# Patient Record
Sex: Male | Born: 1945 | Race: Black or African American | Hispanic: No | Marital: Married | Smoking: Never smoker
Health system: Southern US, Community
[De-identification: ages and names within clinical notes are randomized; demographics above are authoritative.]

## PROBLEM LIST (undated history)

## (undated) DIAGNOSIS — R0602 Shortness of breath: Secondary | ICD-10-CM

## (undated) DIAGNOSIS — K4090 Unilateral inguinal hernia, without obstruction or gangrene, not specified as recurrent: Secondary | ICD-10-CM

## (undated) DIAGNOSIS — E7800 Pure hypercholesterolemia, unspecified: Secondary | ICD-10-CM

## (undated) DIAGNOSIS — Z889 Allergy status to unspecified drugs, medicaments and biological substances status: Secondary | ICD-10-CM

## (undated) DIAGNOSIS — J42 Unspecified chronic bronchitis: Secondary | ICD-10-CM

## (undated) DIAGNOSIS — L03213 Periorbital cellulitis: Secondary | ICD-10-CM

## (undated) DIAGNOSIS — I10 Essential (primary) hypertension: Secondary | ICD-10-CM

## (undated) DIAGNOSIS — F4310 Post-traumatic stress disorder, unspecified: Secondary | ICD-10-CM

## (undated) DIAGNOSIS — J329 Chronic sinusitis, unspecified: Secondary | ICD-10-CM

## (undated) DIAGNOSIS — E119 Type 2 diabetes mellitus without complications: Secondary | ICD-10-CM

## (undated) DIAGNOSIS — J449 Chronic obstructive pulmonary disease, unspecified: Secondary | ICD-10-CM

## (undated) HISTORY — PX: SHOULDER ARTHROSCOPY W/ ROTATOR CUFF REPAIR: SHX2400

---

## 2011-09-07 ENCOUNTER — Emergency Department (INDEPENDENT_AMBULATORY_CARE_PROVIDER_SITE_OTHER)
Admission: EM | Admit: 2011-09-07 | Discharge: 2011-09-07 | Disposition: A | Discharge status: Home / Self Care | Payer: Medicare Other | Source: Home / Self Care

## 2011-09-07 ENCOUNTER — Encounter (HOSPITAL_COMMUNITY): Payer: Self-pay

## 2011-09-07 DIAGNOSIS — J309 Allergic rhinitis, unspecified: Secondary | ICD-10-CM

## 2011-09-07 DIAGNOSIS — J45901 Unspecified asthma with (acute) exacerbation: Secondary | ICD-10-CM

## 2011-09-07 HISTORY — DX: Allergy status to unspecified drugs, medicaments and biological substances: Z88.9

## 2011-09-07 HISTORY — DX: Essential (primary) hypertension: I10

## 2011-09-07 MED ORDER — PREDNISONE 20 MG PO TABS: 40.0000 mg | ORAL_TABLET | Freq: Every day | ORAL | Status: AC

## 2011-09-07 NOTE — Discharge Instructions (Signed)
Thank you for coming in today. Take the prednisone 2 tablets once a day for 5 days. Continue your albuterol inhaler as needed. Call or go to the emergency room if you get worse, have trouble breathing, have chest pains, or palpitations.   Allergic Rhinitis Allergic rhinitis is when the mucous membranes in the nose respond to allergens. Allergens are particles in the air that cause your body to have an allergic reaction. This causes you to release allergic antibodies. Through a chain of events, these eventually cause you to release histamine into the blood stream (hence the use of antihistamines). Although meant to be protective to the body, it is this release that causes your discomfort, such as frequent sneezing, congestion and an itchy runny nose.  CAUSES  The pollen allergens may come from grasses, trees, and weeds. This is seasonal allergic rhinitis, or "hay fever." Other allergens cause year-round allergic rhinitis (perennial allergic rhinitis) such as house dust mite allergen, pet dander and mold spores.  SYMPTOMS   Nasal stuffiness (congestion).   Runny, itchy nose with sneezing and tearing of the eyes.   There is often an itching of the mouth, eyes and ears.  It cannot be cured, but it can be controlled with medications. DIAGNOSIS  If you are unable to determine the offending allergen, skin or blood testing may find it. TREATMENT   Avoid the allergen.   Medications and allergy shots (immunotherapy) can help.   Hay fever may often be treated with antihistamines in pill or nasal spray forms. Antihistamines block the effects of histamine. There are over-the-counter medicines that may help with nasal congestion and swelling around the eyes. Check with your caregiver before taking or giving this medicine.  If the treatment above does not work, there are many new medications your caregiver can prescribe. Stronger medications may be used if initial measures are ineffective. Desensitizing  injections can be used if medications and avoidance fails. Desensitization is when a patient is given ongoing shots until the body becomes less sensitive to the allergen. Make sure you follow up with your caregiver if problems continue. SEEK MEDICAL CARE IF:   You develop fever (more than 100.5 F (38.1 C).   You develop a cough that does not stop easily (persistent).   You have shortness of breath.   You start wheezing.   Symptoms interfere with normal daily activities.  Document Released: 02/15/2001 Document Revised: 05/12/2011 Document Reviewed: 08/27/2008 [facility] Patient Information 2012 [place], [place].

## 2011-09-07 NOTE — ED Provider Notes (Signed)
Travis Dalton is a 66 y.o. male who presents to Urgent Care today for sinus congestion and wheezing for 2 weeks. Associated with clear nasal discharge. Travis Dalton is a patient of the [facility] and treated regularly for asthma exacerbations and sinus congestion. He typically is able to be treated with prednisone 40 mg daily for 5 days.  He denies any fevers or chills does note some wheezing.  He denies any significant dyspnea on exertion.  He has tried multiple products in the past and currently is taking cetirizine and montelukast as well as albuterol.  [facility].   PMH reviewed. Significant for asthma. 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  no chest pains, palpitations, fevers, chills, abdominal pain nausea or vomiting. Medications reviewed. No current facility-administered medications for this encounter.   Current Outpatient Prescriptions  Medication Sig Dispense Refill  . cetirizine (ZYRTEC) 10 MG tablet Take 10 mg by mouth daily.      . montelukast (SINGULAIR) 10 MG tablet Take 10 mg by mouth at bedtime.      . predniSONE (DELTASONE) 20 MG tablet Take 2 tablets (40 mg total) by mouth daily.  10 tablet  0    Exam:  BP 172/93  Pulse 88  Temp(Src) 98.2 F (36.8 C) (Oral)  Resp 20  SpO2 99% Gen: Well NAD HEENT: EOMI,  MMM, tympanic membranes are normal bilaterally. Bilateral nasal turbinate boggy and red. Posterior pharynx is normal appearing Lungs: Expiratory wheezes noted bilaterally however work of breathing is normal. Heart: RRR no MRG Abd: NABS, NT, ND Exts: Non edematous BL  LE, warm and well perfused.   No results found for this or any previous visit (from the past 24 hour(s)). No results found.  Assessment and Plan: 66 y.o. male with nasal congestion and asthma.  This is a routine exacerbation for this patient and he gets good results with  treatment of prednisone. He does not appear to be acutely ill currently, and he has good followup with his regular doctor in one week.  Feel that a trial of his usual prednisone is reasonable. Plan to continue montelukast and Zyrtec and albuterol. Discussed warning signs or symptoms that would promote return to health care. Patient expresses understanding. Please see discharge instructions.     Travis Bong, MD 09/07/11 [phone]

## 2011-09-07 NOTE — ED Notes (Signed)
I get this problem every few months; going to see my MD at the [place] in a few days

## 2011-09-07 NOTE — ED Provider Notes (Signed)
Medical screening examination/treatment/procedure(s) were performed by resident physician or non-physician practitioner and as supervising physician I was immediately available for consultation/collaboration.   Barkley Bruns MD.    Linna Hoff, MD 09/07/11 [phone]

## 2011-09-15 ENCOUNTER — Emergency Department (INDEPENDENT_AMBULATORY_CARE_PROVIDER_SITE_OTHER)
Admission: EM | Admit: 2011-09-15 | Discharge: 2011-09-15 | Disposition: A | Discharge status: Home Health | Payer: Medicare Other | Source: Home / Self Care | Attending: Family Medicine | Admitting: Family Medicine

## 2011-09-15 ENCOUNTER — Emergency Department (INDEPENDENT_AMBULATORY_CARE_PROVIDER_SITE_OTHER): Payer: Medicare Other

## 2011-09-15 ENCOUNTER — Encounter (HOSPITAL_COMMUNITY): Payer: Self-pay

## 2011-09-15 DIAGNOSIS — J45909 Unspecified asthma, uncomplicated: Secondary | ICD-10-CM

## 2011-09-15 MED ORDER — IPRATROPIUM BROMIDE 0.02 % IN SOLN
0.5000 mg | Freq: Once | RESPIRATORY_TRACT | Status: AC
  Administered 2011-09-15: 0.5 mg via RESPIRATORY_TRACT

## 2011-09-15 MED ORDER — METHYLPREDNISOLONE SODIUM SUCC 125 MG IJ SOLR
125.0000 mg | Freq: Once | INTRAMUSCULAR | Status: AC
  Administered 2011-09-15: 125 mg via INTRAMUSCULAR

## 2011-09-15 MED ORDER — ALBUTEROL SULFATE (5 MG/ML) 0.5% IN NEBU
5.0000 mg | INHALATION_SOLUTION | Freq: Once | RESPIRATORY_TRACT | Status: AC
  Administered 2011-09-15: 5 mg via RESPIRATORY_TRACT

## 2011-09-15 MED ORDER — METHYLPREDNISOLONE SODIUM SUCC 125 MG IJ SOLR
INTRAMUSCULAR | Status: AC
  Filled 2011-09-15: qty 2

## 2011-09-15 MED ORDER — PREDNISONE (PAK) 10 MG PO TABS: ORAL_TABLET | ORAL | Status: DC

## 2011-09-15 MED ORDER — ALBUTEROL SULFATE (5 MG/ML) 0.5% IN NEBU
INHALATION_SOLUTION | RESPIRATORY_TRACT | Status: AC
  Filled 2011-09-15: qty 1

## 2011-09-15 NOTE — ED Provider Notes (Signed)
History     CSN: [id number]  Arrival date & time 09/15/11  1243   First MD Initiated Contact with Patient 09/15/11 1415      Chief Complaint  Patient presents with  . Shortness of Breath    (Consider location/radiation/quality/duration/timing/severity/associated sxs/prior treatment) HPI Comments: THE PATIENT REPORTS HE WAS SEEN HERE OVER A WK AGO FOR WHEEZING AND SHORTNESS OF BREATH. TREATED WITH ORAL PREDNISONE. MINIMAL RELIEF. [facility] STILL NO [facility]PROVEMENT. USING HIS INHALER. NO FEVER . HE REPORTS HE SEEMS TO NEED TREATMENT EVERY 60 DAYS FOR THE EXACT SAME SYMPTOMS.   The history is provided by the patient.    Past Medical History  Diagnosis Date  . Hypertension   . Multiple allergies   . Asthma     History reviewed. No pertinent past surgical history.  History reviewed. No pertinent family history.  History  Substance Use Topics  . Smoking status: Never Smoker   . Smokeless tobacco: Not on file  . Alcohol Use: Yes      Review of Systems  Constitutional: Negative.   HENT: Negative.   Respiratory: Positive for cough, shortness of breath and wheezing.   Cardiovascular: Negative.   Gastrointestinal: Negative.   Genitourinary: Negative.   Musculoskeletal: Negative.   Skin: Negative.     Allergies  Review of patient's allergies indicates no known allergies.  Home Medications   Current Outpatient Rx  Name Route Sig Dispense Refill  . CETIRIZINE HCL 10 MG PO TABS Oral Take 10 mg by mouth daily.    Marland Kitchen MONTELUKAST SODIUM 10 MG PO TABS Oral Take 10 mg by mouth at bedtime.    Marland Kitchen PREDNISONE 20 MG PO TABS Oral Take 2 tablets (40 mg total) by mouth daily. 10 tablet 0  . PREDNISONE (PAK) 10 MG PO TABS  DISP 12 DAY TAPER TAKE AS DIRECTED WITH FOOD 42 tablet 0    BP 146/89  Pulse 100  Temp(Src) 98.5 F (36.9 C) (Oral)  Resp 20  SpO2 100%  Physical Exam  Nursing note and vitals reviewed. Constitutional: He appears well-developed and  well-nourished. No distress.       SPEAKING IN FULL SENTENCES.   HENT:  Head: Normocephalic and atraumatic.  Mouth/Throat: Oropharynx is clear and moist.  Neck: Normal range of motion. Neck supple.  Cardiovascular: Normal rate and regular rhythm.   Pulmonary/Chest: Effort normal. He has wheezes.  Musculoskeletal: He exhibits no edema.  Neurological: He is alert.  Skin: Skin is warm and dry. No rash noted. No pallor.    ED Course  Procedures (including critical care time) MUCH IMPROVED POST NEB TX. CHEST XRAY NEG  Labs Reviewed - No data to display Dg Chest 2 View  09/15/2011  *RADIOLOGY REPORT*  Clinical Data: Cough, congestion, shortness of breath  CHEST - 2 VIEW  Comparison: None.  Findings: The lungs are clear and slightly hyperaerated.  Mild peribronchial thickening is noted which may indicate bronchitis. Mediastinal contours appear normal.  The heart is within normal limits in size.  No bony abnormality is seen.  IMPRESSION: No pneumonia.  Peribronchial thickening may indicate bronchitis. Slight hyperaeration.  Original Report Authenticated By: Juline Patch, M.D.     1. Asthmatic bronchitis       MDM          Randa Spike, MD 09/15/11 [phone]

## 2011-09-15 NOTE — ED Notes (Signed)
Pt c/o SOB for past week.  Pt states he was seen here Tuesday, given Rx for prednisone but not effective.  Pt did inhaler and nebs at home with no relief.  Pt has inspiratory and expiratory wheezing bilaterally.

## 2011-09-15 NOTE — Discharge Instructions (Signed)
USE YOUR INHALER AS DIRECTED. USE THE NASAL SPRAY TWICE DAILY FOR NO MORE THAN 4 DAYS THEN CUT TO ONCE DAILY. AVOID MILK PRODUCTS. CONTINUE YOUR SINGULAR AND ZYRTEC. REPORT TO THE LOCAL ER IF SYMPTOMS PERSIST OR WORSEN.

## 2011-11-18 ENCOUNTER — Emergency Department (INDEPENDENT_AMBULATORY_CARE_PROVIDER_SITE_OTHER)
Admission: EM | Admit: 2011-11-18 | Discharge: 2011-11-18 | Disposition: A | Discharge status: Home / Self Care | Payer: Medicare Other | Source: Home / Self Care | Attending: Family Medicine | Admitting: Family Medicine

## 2011-11-18 ENCOUNTER — Encounter (HOSPITAL_COMMUNITY): Payer: Self-pay | Admitting: *Deleted

## 2011-11-18 DIAGNOSIS — J329 Chronic sinusitis, unspecified: Secondary | ICD-10-CM

## 2011-11-18 MED ORDER — AMOXICILLIN-POT CLAVULANATE 875-125 MG PO TABS: 1.0000 | ORAL_TABLET | Freq: Two times a day (BID) | ORAL | Status: AC

## 2011-11-18 NOTE — ED Provider Notes (Signed)
History     CSN: [id number]  Arrival date & time 11/18/11  1333   First MD Initiated Contact with Patient 11/18/11 1504      Chief Complaint  Patient presents with  . URI    (Consider location/radiation/quality/duration/timing/severity/associated sxs/prior treatment) HPI Comments: Pt reports having sx of "a regular cold" several weeks ago that "won't go away".  Thick yellow d/c from nose when can get d/c from nose.    Patient is a 66 y.o. male presenting with URI. The history is provided by the patient.  URI Primary symptoms do not include fever, headaches, ear pain, sore throat or cough. Episode onset: several weeks ago. This is a new problem. The problem has not changed since onset. Symptoms associated with the illness include sinus pressure and congestion. The illness is not associated with chills, facial pain or rhinorrhea.    Past Medical History  Diagnosis Date  . Hypertension   . Multiple allergies   . Asthma   . Diabetes mellitus     History reviewed. No pertinent past surgical history.  History reviewed. No pertinent family history.  History  Substance Use Topics  . Smoking status: Never Smoker   . Smokeless tobacco: Not on file  . Alcohol Use: No      Review of Systems  Constitutional: Negative for fever and chills.  HENT: Positive for congestion and sinus pressure. Negative for ear pain, sore throat, rhinorrhea and postnasal drip.   Respiratory: Negative for cough.   Neurological: Negative for headaches.    Allergies  Review of patient's allergies indicates no known allergies.  Home Medications   Current Outpatient Rx  Name Route Sig Dispense Refill  . HYDROCHLOROTHIAZIDE PO Oral Take by mouth.    . METFORMIN HCL PO Oral Take by mouth 2 (two) times daily.    . AMOXICILLIN-POT CLAVULANATE 875-125 MG PO TABS Oral Take 1 tablet by mouth 2 (two) times daily. 20 tablet 0  . CETIRIZINE HCL 10 MG PO TABS Oral Take 10 mg by mouth daily.    Marland Kitchen  MONTELUKAST SODIUM 10 MG PO TABS Oral Take 10 mg by mouth at bedtime.    Marland Kitchen PREDNISONE (PAK) 10 MG PO TABS  DISP 12 DAY TAPER TAKE AS DIRECTED WITH FOOD 42 tablet 0    BP 125/91  Pulse 76  Temp 98.3 F (36.8 C) (Oral)  Resp 18  SpO2 99%  Physical Exam  Constitutional: He appears well-developed and well-nourished. No distress.  HENT:  Right Ear: External ear and ear canal normal. Tympanic membrane is retracted.  Left Ear: External ear and ear canal normal. Tympanic membrane is retracted.  Nose: Mucosal edema present. Right sinus exhibits no maxillary sinus tenderness and no frontal sinus tenderness. Left sinus exhibits no maxillary sinus tenderness and no frontal sinus tenderness.  Mouth/Throat: Oropharynx is clear and moist.       Thick, purulent drainage in B nares.  Congestion so severe pt cannot breathe through nose.   Cardiovascular: Normal rate and regular rhythm.   Pulmonary/Chest: Effort normal and breath sounds normal.  Lymphadenopathy:       Head (right side): No submental, no submandibular, no tonsillar and no preauricular adenopathy present.       Head (left side): No submental, no submandibular, no tonsillar and no preauricular adenopathy present.    He has no cervical adenopathy.    ED Course  Procedures (including critical care time)  Labs Reviewed - No data to display No results found.  1. Sinusitis       MDM          Cathlyn Parsons, NP 11/18/11 1515

## 2011-11-18 NOTE — Discharge Instructions (Signed)
Use saline spray and/or a neti pot with saline in your nose to help thin out the drainage and move it out.  Drink LOTS of liquids. Finish ALL of the antibiotics, even if you are feeling better.   Sinusitis Sinusitis an infection of the air pockets (sinuses) in your face. This can cause puffiness (swelling). It can also cause drainage from your sinuses.  HOME CARE   Only take medicine as told by your doctor.   Drink enough fluids to keep your pee (urine) clear or pale yellow.   Apply moist heat or ice packs for pain relief.   Use salt (saline) nose sprays. The spray will wet the thick fluid in the nose. This can help the sinuses drain.  GET HELP RIGHT AWAY IF:   You have a fever.   Your baby is older than 3 months with a rectal temperature of 102 F (38.9 C) or higher.   Your baby is 47 months old or younger with a rectal temperature of 100.4 F (38 C) or higher.   The pain gets worse.   You get a very bad headache.   You keep throwing up (vomiting).   Your face gets puffy.  MAKE SURE YOU:   Understand these instructions.   Will watch your condition.   Will get help right away if you are not doing well or get worse.  Document Released: 11/09/2007 Document Revised: 05/12/2011 Document Reviewed: 11/09/2007 [facility] Patient Information 2012 [place], [place].

## 2011-11-18 NOTE — ED Notes (Signed)
Pt  Has  Seasonal  allergys     And  Sinus problems  - he  Reports   sev weeks  Of  Sinus  Congested  wih  Stuffy  Nose  -  No  Real  Cough  He  States -  He  Appears  In no  Distress

## 2011-11-19 NOTE — ED Provider Notes (Signed)
Medical screening examination/treatment/procedure(s) were performed by resident physician or non-physician practitioner and as supervising physician I was immediately available for consultation/collaboration.   Saige Busby DOUGLAS MD.    Raelle Chambers D Jaziel Bennett, MD 11/19/11 1111 

## 2011-11-29 ENCOUNTER — Emergency Department (INDEPENDENT_AMBULATORY_CARE_PROVIDER_SITE_OTHER)
Admission: EM | Admit: 2011-11-29 | Discharge: 2011-11-29 | Disposition: A | Discharge status: Home / Self Care | Payer: Medicare Other | Source: Home / Self Care | Attending: Emergency Medicine | Admitting: Emergency Medicine

## 2011-11-29 ENCOUNTER — Encounter (HOSPITAL_COMMUNITY): Payer: Self-pay | Admitting: *Deleted

## 2011-11-29 DIAGNOSIS — J310 Chronic rhinitis: Secondary | ICD-10-CM

## 2011-11-29 DIAGNOSIS — J329 Chronic sinusitis, unspecified: Secondary | ICD-10-CM

## 2011-11-29 MED ORDER — AMOXICILLIN-POT CLAVULANATE 500-125 MG PO TABS: 1.0000 | ORAL_TABLET | Freq: Three times a day (TID) | ORAL | Status: AC

## 2011-11-29 MED ORDER — PREDNISONE 20 MG PO TABS: 40.0000 mg | ORAL_TABLET | Freq: Every day | ORAL | Status: AC

## 2011-11-29 NOTE — Discharge Instructions (Signed)

## 2011-11-29 NOTE — ED Provider Notes (Signed)
History     CSN: [id number]  Arrival date & time 11/29/11  1241   First MD Initiated Contact with Patient 11/29/11 1325      Chief Complaint  Patient presents with  . Facial Pain    (Consider location/radiation/quality/duration/timing/severity/associated sxs/prior treatment) HPI Comments: Patient returns today after having been seen by his doctor at the [facility] was prescribed azithromycin and prednisone for an ongoing sinus infection. Patient admitted that he discontinue the previously prescribed Augmentin that was prescribed and days before on his previous visit to urgent care. Patient continues expresses sinus pressure (points to both maxillary and frontal sinuses and an ongoing rhinorrhea). Patient denies any fevers, some discomfort in his throat secondary to postnasal dripping as he describes. Patient takes Zyrtec and Singulair chronically for allergies. Patient is awaiting the ear from his Dr. as they told him over the phone that they would not prescribe something but he is unaware what it is an MI take 5-6 days and he's feels as sinus congestion his not getting any better.  The history is provided by the patient.    Past Medical History  Diagnosis Date  . Hypertension   . Multiple allergies   . Asthma   . Diabetes mellitus     History reviewed. No pertinent past surgical history.  No family history on file.  History  Substance Use Topics  . Smoking status: Never Smoker   . Smokeless tobacco: Not on file  . Alcohol Use: No      Review of Systems  Constitutional: Negative for fever, chills, diaphoresis, activity change and fatigue.  HENT: Positive for congestion, rhinorrhea, postnasal drip and sinus pressure. Negative for sore throat, facial swelling, drooling, mouth sores, trouble swallowing, neck pain, neck stiffness, dental problem and voice change.   Eyes: Negative for redness and itching.  Skin: Negative for rash.  Neurological: Negative for dizziness,  weakness, light-headedness and headaches.    Allergies  Review of patient's allergies indicates no known allergies.  Home Medications   Current Outpatient Rx  Name Route Sig Dispense Refill  . AMOXICILLIN-POT CLAVULANATE 500-125 MG PO TABS Oral Take 1 tablet (500 mg total) by mouth every 8 (eight) hours. 20 tablet 0  . AMOXICILLIN-POT CLAVULANATE 875-125 MG PO TABS Oral Take 1 tablet by mouth 2 (two) times daily. 20 tablet 0  . CETIRIZINE HCL 10 MG PO TABS Oral Take 10 mg by mouth daily.    Marland Kitchen HYDROCHLOROTHIAZIDE PO Oral Take by mouth.    . METFORMIN HCL PO Oral Take by mouth 2 (two) times daily.    Marland Kitchen MONTELUKAST SODIUM 10 MG PO TABS Oral Take 10 mg by mouth at bedtime.    Marland Kitchen PREDNISONE 20 MG PO TABS Oral Take 2 tablets (40 mg total) by mouth daily. 2 tablets daily for 5 days 10 tablet 0  . PREDNISONE (PAK) 10 MG PO TABS  DISP 12 DAY TAPER TAKE AS DIRECTED WITH FOOD 42 tablet 0    BP 160/82  Pulse 96  Temp 97.9 F (36.6 C) (Oral)  Resp 20  SpO2 98%  Physical Exam  Nursing note and vitals reviewed. Constitutional: He appears well-developed and well-nourished.  Non-toxic appearance. He does not have a sickly appearance. He does not appear ill. No distress.  HENT:  Head: Normocephalic.  Mouth/Throat: Uvula is midline and mucous membranes are normal. Posterior oropharyngeal erythema present. No oropharyngeal exudate, posterior oropharyngeal edema or tonsillar abscesses.  Eyes: Conjunctivae are normal. Right eye exhibits no discharge. Left eye  exhibits no discharge.  Neck: Neck supple. No JVD present.  Pulmonary/Chest: Effort normal and breath sounds normal. He has no decreased breath sounds.  Skin: No rash noted.    ED Course  Procedures (including critical care time)  Labs Reviewed - No data to display No results found.   1. Sinusitis   2. Rhinitis       MDM  Recurrent sinusitis. With incomplete antibiotic cycle. Patient has been encouraged to complete Augmentin and a  five-day treatment course of prednisone and followup with ENT if no improvement is noted. Discuss what symptoms will warrant further evaluation members apartment. Patient agree with treatment plan and followup care as necessary        Jimmie Molly, MD 11/29/11 [phone]

## 2011-11-29 NOTE — ED Notes (Signed)
Pt  Was  Seen  11  Days ago  For  A  Sinus  Infection  He  Was  rx     augmentin   He  Reports       [facility]  [facility]   [facility]      [facility]    And  Was  rx  z  Max        -  Therefore  He  Only   Took  1/2  Of the  augmentin   He now  Reports  Frontal  Headache      And congestion

## 2012-03-15 ENCOUNTER — Inpatient Hospital Stay (HOSPITAL_BASED_OUTPATIENT_CLINIC_OR_DEPARTMENT_OTHER)
Admission: EM | Admit: 2012-03-15 | Discharge: 2012-03-16 | DRG: 603 | Disposition: A | Discharge status: Home / Self Care | Payer: Medicare Other | Attending: Internal Medicine | Admitting: Internal Medicine

## 2012-03-15 ENCOUNTER — Encounter (HOSPITAL_BASED_OUTPATIENT_CLINIC_OR_DEPARTMENT_OTHER): Payer: Self-pay | Admitting: *Deleted

## 2012-03-15 ENCOUNTER — Emergency Department (HOSPITAL_BASED_OUTPATIENT_CLINIC_OR_DEPARTMENT_OTHER): Payer: Medicare Other

## 2012-03-15 DIAGNOSIS — J019 Acute sinusitis, unspecified: Secondary | ICD-10-CM | POA: Diagnosis present

## 2012-03-15 DIAGNOSIS — J329 Chronic sinusitis, unspecified: Secondary | ICD-10-CM

## 2012-03-15 DIAGNOSIS — L03213 Periorbital cellulitis: Secondary | ICD-10-CM | POA: Diagnosis present

## 2012-03-15 DIAGNOSIS — I10 Essential (primary) hypertension: Secondary | ICD-10-CM | POA: Diagnosis present

## 2012-03-15 DIAGNOSIS — L0201 Cutaneous abscess of face: Principal | ICD-10-CM | POA: Diagnosis present

## 2012-03-15 DIAGNOSIS — E785 Hyperlipidemia, unspecified: Secondary | ICD-10-CM | POA: Diagnosis present

## 2012-03-15 DIAGNOSIS — Z79899 Other long term (current) drug therapy: Secondary | ICD-10-CM

## 2012-03-15 DIAGNOSIS — J0191 Acute recurrent sinusitis, unspecified: Secondary | ICD-10-CM

## 2012-03-15 DIAGNOSIS — J4520 Mild intermittent asthma, uncomplicated: Secondary | ICD-10-CM | POA: Diagnosis not present

## 2012-03-15 DIAGNOSIS — J45909 Unspecified asthma, uncomplicated: Secondary | ICD-10-CM | POA: Diagnosis present

## 2012-03-15 DIAGNOSIS — E119 Type 2 diabetes mellitus without complications: Secondary | ICD-10-CM | POA: Diagnosis present

## 2012-03-15 DIAGNOSIS — L03211 Cellulitis of face: Principal | ICD-10-CM | POA: Diagnosis present

## 2012-03-15 DIAGNOSIS — H00039 Abscess of eyelid unspecified eye, unspecified eyelid: Secondary | ICD-10-CM

## 2012-03-15 HISTORY — DX: Pure hypercholesterolemia, unspecified: E78.00

## 2012-03-15 HISTORY — DX: Type 2 diabetes mellitus without complications: E11.9

## 2012-03-15 HISTORY — DX: Chronic sinusitis, unspecified: J32.9

## 2012-03-15 HISTORY — DX: Unspecified chronic bronchitis: J42

## 2012-03-15 HISTORY — DX: Chronic obstructive pulmonary disease, unspecified: J44.9

## 2012-03-15 HISTORY — DX: Unilateral inguinal hernia, without obstruction or gangrene, not specified as recurrent: K40.90

## 2012-03-15 HISTORY — DX: Periorbital cellulitis: L03.213

## 2012-03-15 HISTORY — DX: Shortness of breath: R06.02

## 2012-03-15 HISTORY — DX: Post-traumatic stress disorder, unspecified: F43.10

## 2012-03-15 LAB — CBC WITH DIFFERENTIAL/PLATELET
Basophils Absolute: 0 K/uL (ref 0.0–0.1)
Basophils Absolute: 0 10*3/uL (ref 0.0–0.1)
Basophils Relative: 0 % (ref 0–1)
Basophils Relative: 0 % (ref 0–1)
Eosinophils Absolute: 0.6 K/uL (ref 0.0–0.7)
Eosinophils Relative: 8 % — ABNORMAL HIGH (ref 0–5)
HCT: 42.6 % (ref 39.0–52.0)
Hemoglobin: 14.2 g/dL (ref 13.0–17.0)
Hemoglobin: 14.1 g/dL (ref 13.0–17.0)
Lymphocytes Relative: 41 % (ref 12–46)
Lymphocytes Relative: 9 % — ABNORMAL LOW (ref 12–46)
Lymphs Abs: 3.4 K/uL (ref 0.7–4.0)
MCH: 25.8 pg — ABNORMAL LOW (ref 26.0–34.0)
MCHC: 33.3 g/dL (ref 30.0–36.0)
MCHC: 33.5 g/dL (ref 30.0–36.0)
MCV: 77.5 fL — ABNORMAL LOW (ref 78.0–100.0)
Monocytes Absolute: 0.7 K/uL (ref 0.1–1.0)
Monocytes Relative: 9 % (ref 3–12)
Monocytes Relative: 1 % — ABNORMAL LOW (ref 3–12)
Neutro Abs: 3.6 K/uL (ref 1.7–7.7)
Neutro Abs: 8.5 10*3/uL — ABNORMAL HIGH (ref 1.7–7.7)
Neutrophils Relative %: 43 % (ref 43–77)
Neutrophils Relative %: 90 % — ABNORMAL HIGH (ref 43–77)
Platelets: 265 K/uL (ref 150–400)
RBC: 5.5 MIL/uL (ref 4.22–5.81)
RBC: 5.39 MIL/uL (ref 4.22–5.81)
RDW: 13.3 % (ref 11.5–15.5)
WBC: 8.3 K/uL (ref 4.0–10.5)
WBC: 9.5 10*3/uL (ref 4.0–10.5)

## 2012-03-15 LAB — COMPREHENSIVE METABOLIC PANEL WITH GFR
ALT: 22 U/L (ref 0–53)
AST: 18 U/L (ref 0–37)
Albumin: 3.7 g/dL (ref 3.5–5.2)
Alkaline Phosphatase: 56 U/L (ref 39–117)
BUN: 16 mg/dL (ref 6–23)
CO2: 29 meq/L (ref 19–32)
Calcium: 9.1 mg/dL (ref 8.4–10.5)
Chloride: 102 meq/L (ref 96–112)
Creatinine, Ser: 1.2 mg/dL (ref 0.50–1.35)
GFR calc Af Amer: 70 mL/min — ABNORMAL LOW
GFR calc non Af Amer: 60 mL/min — ABNORMAL LOW
Glucose, Bld: 68 mg/dL — ABNORMAL LOW (ref 70–99)
Potassium: 3.5 meq/L (ref 3.5–5.1)
Sodium: 139 meq/L (ref 135–145)
Total Bilirubin: 0.8 mg/dL (ref 0.3–1.2)
Total Protein: 7.3 g/dL (ref 6.0–8.3)

## 2012-03-15 MED ORDER — METFORMIN HCL 500 MG PO TABS
500.0000 mg | ORAL_TABLET | Freq: Two times a day (BID) | ORAL | Status: DC
  Administered 2012-03-16: 500 mg via ORAL
  Filled 2012-03-15 (×3): qty 1

## 2012-03-15 MED ORDER — ADULT MULTIVITAMIN W/MINERALS CH
1.0000 | ORAL_TABLET | Freq: Every day | ORAL | Status: DC
  Administered 2012-03-15 – 2012-03-16 (×2): 1 via ORAL
  Filled 2012-03-15 (×2): qty 1

## 2012-03-15 MED ORDER — ALBUTEROL SULFATE HFA 108 (90 BASE) MCG/ACT IN AERS
2.0000 | INHALATION_SPRAY | Freq: Four times a day (QID) | RESPIRATORY_TRACT | Status: DC | PRN
  Filled 2012-03-15: qty 6.7

## 2012-03-15 MED ORDER — OXYMETAZOLINE HCL 0.05 % NA SOLN
1.0000 | Freq: Two times a day (BID) | NASAL | Status: DC
  Administered 2012-03-15: 1 via NASAL
  Filled 2012-03-15: qty 15

## 2012-03-15 MED ORDER — SODIUM CHLORIDE 0.9 % IJ SOLN: 3.0000 mL | INTRAMUSCULAR | Status: DC | PRN

## 2012-03-15 MED ORDER — MULTI-VITAMIN/MINERALS PO TABS
1.0000 | ORAL_TABLET | Freq: Every day | ORAL | Status: DC
Start: 2012-03-15 — End: 2012-03-15

## 2012-03-15 MED ORDER — VANCOMYCIN HCL IN DEXTROSE 1-5 GM/200ML-% IV SOLN
1000.0000 mg | Freq: Once | INTRAVENOUS | Status: AC
  Administered 2012-03-15: 1000 mg via INTRAVENOUS
  Filled 2012-03-15: qty 200

## 2012-03-15 MED ORDER — MONTELUKAST SODIUM 10 MG PO TABS
10.0000 mg | ORAL_TABLET | Freq: Every day | ORAL | Status: DC
  Administered 2012-03-15: 10 mg via ORAL
  Filled 2012-03-15 (×2): qty 1

## 2012-03-15 MED ORDER — GLIPIZIDE 2.5 MG HALF TABLET
2.5000 mg | ORAL_TABLET | Freq: Two times a day (BID) | ORAL | Status: DC
  Administered 2012-03-16: 2.5 mg via ORAL
  Filled 2012-03-15 (×3): qty 1

## 2012-03-15 MED ORDER — HYDROCHLOROTHIAZIDE 25 MG PO TABS
12.5000 mg | ORAL_TABLET | Freq: Every day | ORAL | Status: DC
  Administered 2012-03-16: 12.5 mg via ORAL
  Filled 2012-03-15: qty 0.5

## 2012-03-15 MED ORDER — ATORVASTATIN CALCIUM 20 MG PO TABS
20.0000 mg | ORAL_TABLET | Freq: Every day | ORAL | Status: DC
  Administered 2012-03-15 – 2012-03-16 (×2): 20 mg via ORAL
  Filled 2012-03-15 (×2): qty 1

## 2012-03-15 MED ORDER — ALBUTEROL SULFATE (5 MG/ML) 0.5% IN NEBU: 2.5000 mg | INHALATION_SOLUTION | Freq: Four times a day (QID) | RESPIRATORY_TRACT | Status: DC | PRN

## 2012-03-15 MED ORDER — METHYLPREDNISOLONE SODIUM SUCC 40 MG IJ SOLR
40.0000 mg | Freq: Four times a day (QID) | INTRAMUSCULAR | Status: DC
  Administered 2012-03-15 – 2012-03-16 (×4): 40 mg via INTRAVENOUS
  Filled 2012-03-15 (×7): qty 1

## 2012-03-15 MED ORDER — SODIUM CHLORIDE 0.9 % IJ SOLN
3.0000 mL | Freq: Two times a day (BID) | INTRAMUSCULAR | Status: DC
  Administered 2012-03-15: 3 mL via INTRAVENOUS

## 2012-03-15 MED ORDER — INSULIN ASPART 100 UNIT/ML ~~LOC~~ SOLN: 0.0000 [IU] | Freq: Three times a day (TID) | SUBCUTANEOUS | Status: DC

## 2012-03-15 MED ORDER — SODIUM CHLORIDE 0.9 % IV BOLUS (SEPSIS)
500.0000 mL | Freq: Once | INTRAVENOUS | Status: AC
  Administered 2012-03-15: 12:00:00 via INTRAVENOUS

## 2012-03-15 MED ORDER — FLUNISOLIDE 25 MCG/ACT (0.025%) NA SOLN
2.0000 | Freq: Two times a day (BID) | NASAL | Status: DC | PRN
  Filled 2012-03-15: qty 25

## 2012-03-15 MED ORDER — INSULIN ASPART 100 UNIT/ML ~~LOC~~ SOLN
0.0000 [IU] | Freq: Every day | SUBCUTANEOUS | Status: DC
  Administered 2012-03-15: 4 [IU] via SUBCUTANEOUS

## 2012-03-15 MED ORDER — FLUTICASONE PROPIONATE 50 MCG/ACT NA SUSP
2.0000 | Freq: Every day | NASAL | Status: DC
  Administered 2012-03-15 – 2012-03-16 (×2): 2 via NASAL
  Filled 2012-03-15: qty 16

## 2012-03-15 MED ORDER — FAMOTIDINE 20 MG PO TABS
20.0000 mg | ORAL_TABLET | Freq: Two times a day (BID) | ORAL | Status: DC
  Administered 2012-03-15 – 2012-03-16 (×2): 20 mg via ORAL
  Filled 2012-03-15 (×3): qty 1

## 2012-03-15 MED ORDER — DEXTROSE 5 % IV SOLN
1.0000 g | Freq: Once | INTRAVENOUS | Status: AC
  Administered 2012-03-15: 1 g via INTRAVENOUS
  Filled 2012-03-15: qty 10

## 2012-03-15 MED ORDER — SODIUM CHLORIDE 0.9 % IV SOLN
3.0000 g | Freq: Four times a day (QID) | INTRAVENOUS | Status: DC
  Administered 2012-03-15 – 2012-03-16 (×3): 3 g via INTRAVENOUS
  Filled 2012-03-15 (×7): qty 3

## 2012-03-15 MED ORDER — PSEUDOEPHEDRINE HCL 30 MG PO TABS
30.0000 mg | ORAL_TABLET | Freq: Four times a day (QID) | ORAL | Status: DC | PRN
  Filled 2012-03-15: qty 1

## 2012-03-15 MED ORDER — PIPERACILLIN-TAZOBACTAM 3.375 G IVPB
3.3750 g | Freq: Once | INTRAVENOUS | Status: AC
  Administered 2012-03-15: 3.375 g via INTRAVENOUS
  Filled 2012-03-15: qty 50

## 2012-03-15 MED ORDER — INSULIN ASPART 100 UNIT/ML ~~LOC~~ SOLN
0.0000 [IU] | Freq: Three times a day (TID) | SUBCUTANEOUS | Status: DC
  Administered 2012-03-16: 8 [IU] via SUBCUTANEOUS
  Administered 2012-03-16: 3 [IU] via SUBCUTANEOUS

## 2012-03-15 MED ORDER — LORATADINE 10 MG PO TABS
10.0000 mg | ORAL_TABLET | Freq: Every day | ORAL | Status: DC
  Administered 2012-03-15 – 2012-03-16 (×2): 10 mg via ORAL
  Filled 2012-03-15 (×2): qty 1

## 2012-03-15 MED ORDER — METHYLPREDNISOLONE SODIUM SUCC 125 MG IJ SOLR
125.0000 mg | Freq: Once | INTRAMUSCULAR | Status: AC
  Administered 2012-03-15: 125 mg via INTRAVENOUS
  Filled 2012-03-15: qty 2

## 2012-03-15 MED ORDER — SODIUM CHLORIDE 0.9 % IV SOLN: 250.0000 mL | INTRAVENOUS | Status: DC | PRN

## 2012-03-15 MED ORDER — ENOXAPARIN SODIUM 40 MG/0.4ML ~~LOC~~ SOLN
40.0000 mg | SUBCUTANEOUS | Status: DC
  Administered 2012-03-15: 40 mg via SUBCUTANEOUS
  Filled 2012-03-15 (×2): qty 0.4

## 2012-03-15 MED ORDER — BUDESONIDE-FORMOTEROL FUMARATE 160-4.5 MCG/ACT IN AERO
1.0000 | INHALATION_SPRAY | Freq: Two times a day (BID) | RESPIRATORY_TRACT | Status: DC
  Administered 2012-03-15 – 2012-03-16 (×2): 1 via RESPIRATORY_TRACT
  Filled 2012-03-15: qty 6

## 2012-03-15 MED ORDER — GUAIFENESIN-CODEINE 100-10 MG/5ML PO SOLN: 10.0000 mL | Freq: Four times a day (QID) | ORAL | Status: DC | PRN

## 2012-03-15 NOTE — ED Notes (Signed)
Here for complaint of stuffy head and runny nose since yesterday. States he was treated for sinusitis the first of October while out of town and just finished Prednisone.

## 2012-03-15 NOTE — ED Provider Notes (Addendum)
History     CSN: [id number]  Arrival date & time 03/15/12  1137   First MD Initiated Contact with Patient 03/15/12 1147      Chief Complaint  Patient presents with  . Recurrent Sinusitis    (Consider location/radiation/quality/duration/timing/severity/associated sxs/prior treatment) HPI Patient with history diagnosed with sinusitis 10/1 and finished meds (prednisone and levaquin) Tuesday.  Became congested again last night and began feeling like nose clogged up and pressure in face today with chills.  Patient with history of asthma sttes he had cxr when diagnosed with sinusitis in [place] last week.  [facility]   Past Medical History  Diagnosis Date  . Hypertension   . Multiple allergies   . Asthma   . Diabetes mellitus   . Sinusitis     History reviewed. No pertinent past surgical history.  No family history on file.  History  Substance Use Topics  . Smoking status: Never Smoker   . Smokeless tobacco: Not on file  . Alcohol Use: No      Review of Systems  Constitutional: Positive for chills. Negative for fever.  HENT: Negative for neck pain and neck stiffness.   Eyes: Negative for visual disturbance.  Respiratory: Negative for cough and shortness of breath.   Cardiovascular: Negative for chest pain.  Gastrointestinal: Negative for vomiting, diarrhea and blood in stool.  Genitourinary: Negative for dysuria, frequency and decreased urine volume.  Musculoskeletal: Negative for myalgias and joint swelling.  Skin: Negative for rash.  Neurological: Negative for weakness.  Hematological: Negative for adenopathy.  Psychiatric/Behavioral: Negative for agitation.    Allergies  Review of patient's allergies indicates no known allergies.  Home Medications   Current Outpatient Rx  Name Route Sig Dispense Refill  . CETIRIZINE HCL 10 MG PO TABS Oral Take 10 mg by mouth daily.    Marland Kitchen HYDROCHLOROTHIAZIDE PO Oral Take by mouth.    . METFORMIN HCL PO Oral Take by mouth 2  (two) times daily.    Marland Kitchen MONTELUKAST SODIUM 10 MG PO TABS Oral Take 10 mg by mouth at bedtime.    Marland Kitchen PREDNISONE (PAK) 10 MG PO TABS  DISP 12 DAY TAPER TAKE AS DIRECTED WITH FOOD 42 tablet 0    BP 146/109  Pulse 95  Temp 98.2 F (36.8 C) (Oral)  Resp 20  SpO2 100%  Physical Exam  Nursing note and vitals reviewed. Constitutional: He is oriented to person, place, and time. He appears well-developed and well-nourished.  HENT:       Facial erythema of the left periorbital area with diffuse facial tenderness  Eyes: EOM are normal. Pupils are equal, round, and reactive to light.       Left conjuctiva injected.   Neck: Normal range of motion.  Cardiovascular: Normal rate and regular rhythm.   Pulmonary/Chest: Effort normal and breath sounds normal.  Abdominal: Soft. Bowel sounds are normal.  Neurological: He is alert and oriented to person, place, and time. He has normal reflexes.  Skin: Skin is warm and dry.  Psychiatric: He has a normal mood and affect. His behavior is normal. Judgment and thought content normal.    ED Course  Procedures (including critical care time)  Labs Reviewed - No data to display No results found.   No diagnosis found.    Patient with history of sinusitis. He was given 1 g of Rocephin. CT results obtained. Patient remains hemodynamically stable. He does have some facial swelling and preseptal cellulitis on the left side. Given her  comorbidities of diabetes, failed outpatient therapy, and facial cellulitis patient will be admitted to [facility]. I have discussed the patient's care with Dr.Abrol.    After discussion with Dr. Ricard Dillon blood cultures were ordered and Zosyn and vancomycin ordered.      Hilario Quarry, MD 03/15/12 1545  Hilario Quarry, MD 03/15/12 1610  Hilario Quarry, MD 05/02/12 [id number]

## 2012-03-15 NOTE — ED Notes (Signed)
Pt. Is in no distress and show no s/s of hypoglycemia.  Pt. Has drink at bedside.

## 2012-03-15 NOTE — ED Notes (Signed)
Pt. Had rocephin before he had blood cultures done x 2.  Dr. Rosalia Hammers aware of this.

## 2012-03-15 NOTE — H&P (Addendum)
[facility] History and Physical  Travis Dalton AVW:[id number] DOB: 1945/08/04 DOA: 03/15/2012  Referring physician: Rhina Brackett from [facility] PCP: Follows at [facility]  Chief Complaint: Recurrent sinusitis for past few weeks  HPI:  66 year old pleasant African American male with history of hypertension, diabetes mellitus on oral hypoglycemics, hyperlipidemia with symptoms of recurrent rhinosinusitis and asthma-like symptoms for past 3 years was sent from [facility] [employer] [facility] for recurrent sinusitis. Patient informs that for past 3 years he has been having at least 4 episodes of nasal congestion and discharged with sinusitis-like symptoms that last anywhere from 3-4 days 3-4 weeks associated with some facial pain and whitish to yellowish nasal discharge and headaches. He denies any fevers or chills. Along with this she has been having asthma-like symptoms with some shortness of breath and wheezing. He has been treated multiple times with steroids, and nasal decongestant and antibiotics. He recently was prescribed a course of prednisone and levofloxacin that he completed. He presented to the ED today with symptoms off nasal congestion swelling of the nose and some swelling of the right upper lip more past 2 days. His wife also mentions noticing small amount of bleeding from the nose noticed since yesterday. Patient had a maxillofacial CT done in the ED which showed pansinusitis with left radial vital cellulitis and was transferred to [facility] for admission. My evaluation patient informs of nasal congestion with myofascial pain without any fever, chills or headache. He denies any nausea, vomiting, shortness of breath, chest pain, palpitations, bowel or urinary symptoms. He usually follows at [facility] and has not seen any ENT in the past for his symptoms.  Review of Systems:  Constitutional: Denies fever, chills, diaphoresis, appetite change and fatigue.  HEENT: Severe   nasal congestion and rhinorrhea . Has occasional facial pain. Denies photophobia, eye pain, redness, hearing loss, ear pain, sore throat,sneezing, mouth sores, trouble swallowing, neck pain, neck stiffness and tinnitus.   Respiratory: Denies SOB, DOE, cough, chest tightness,  and wheezing.   Cardiovascular: Denies chest pain, palpitations and leg swelling.  Gastrointestinal: Denies nausea, vomiting, abdominal pain, diarrhea, constipation, blood in stool and abdominal distention.  Genitourinary: Denies dysuria, urgency, frequency, hematuria, flank pain and difficulty urinating.  Musculoskeletal: Denies myalgias, back pain, joint swelling, arthralgias and gait problem.  Skin: Denies pallor, rash and wound.  Neurological: Denies dizziness, seizures, syncope, weakness, light-headedness, numbness and headaches.  Hematological: Denies adenopathy. Easy bruising, personal or family bleeding history  Psychiatric/Behavioral: Denies suicidal ideation, mood changes, confusion, nervousness, sleep disturbance and agitation   Past Medical History  Diagnosis Date  . Hypertension   . Multiple allergies   . Asthma   . Diabetes mellitus   . Sinusitis    History reviewed. No pertinent past surgical history. Social History:  reports that he has never smoked. He does not have any smokeless tobacco history on file. He reports that he does not drink alcohol or use illicit drugs.  No Known Allergies  No family history on file.  Prior to Admission medications   Medication Sig Start Date End Date Taking? Authorizing Provider  atorvastatin (LIPITOR) 40 MG tablet Take 20 mg by mouth daily.    Yes Historical Provider, MD  budesonide-formoterol (SYMBICORT) 160-4.5 MCG/ACT inhaler Inhale 1 puff into the lungs 2 (two) times daily.    Yes Historical Provider, MD  cetirizine (ZYRTEC) 10 MG tablet Take 10 mg by mouth at bedtime.    Yes Historical Provider, MD  fish oil-omega-3 fatty acids 1000 MG  capsule Take 1 g by  mouth daily.   Yes Historical Provider, MD  flunisolide (NASALIDE) 0.025 % SOLN Inhale 2 sprays into the lungs 2 (two) times daily as needed. For congestion   Yes Historical Provider, MD  glipiZIDE (GLUCOTROL) 5 MG tablet Take 2.5 mg by mouth 2 (two) times daily before a meal.    Yes Historical Provider, MD  guaiFENesin-codeine 100-10 MG/5ML syrup Take 10 mLs by mouth 4 (four) times daily as needed. For cough   Yes Historical Provider, MD  hydrochlorothiazide (HYDRODIURIL) 25 MG tablet Take 12.5 mg by mouth daily.   Yes Historical Provider, MD  Ibuprofen-Diphenhydramine HCl (ADVIL PM) 200-25 MG CAPS Take 2 tablets by mouth at bedtime as needed. To help rest   Yes Historical Provider, MD  levofloxacin (LEVAQUIN) 500 MG tablet Take 500 mg by mouth daily. Finished last dose on 03-12-2012   Yes Historical Provider, MD  lisinopril (PRINIVIL,ZESTRIL) 10 MG tablet Take 5 mg by mouth daily. Patient ran out of lisinopril on 03-12-2012  Has started taking some old HCTZ  He had at home until he could see doctor   Yes Historical Provider, MD  metFORMIN (GLUCOPHAGE) 500 MG tablet Take 500 mg by mouth 2 (two) times daily with a meal.   Yes Historical Provider, MD  montelukast (SINGULAIR) 10 MG tablet Take 10 mg by mouth at bedtime.   Yes Historical Provider, MD  Multiple Vitamins-Minerals (MULTIVITAMIN WITH MINERALS) tablet Take 1 tablet by mouth daily.   Yes Historical Provider, MD  predniSONE (STERAPRED UNI-PAK) 10 MG tablet DISP 12 DAY TAPER TAKE AS DIRECTED WITH FOOD last dose taken on 03-12-2012 09/15/11  Yes Randa Spike, MD    Physical Exam:  Filed Vitals:   03/15/12 1150 03/15/12 1542 03/15/12 1730  BP: 146/109 162/94 155/85  Pulse: 95 81 95  Temp: 98.2 F (36.8 C)  97.9 F (36.6 C)  TempSrc: Oral  Oral  Resp: 20 18 18   Height:  6' (1.829 m)   Weight:  88.451 kg (195 lb) 84.1 kg (185 lb 6.5 oz)  SpO2: 100% 98% 97%    Constitutional: Vital signs reviewed.  Patient is a well-developed and  well-nourished in no acute distress and cooperative with exam. Alert and oriented x3.  HEENT: No pallor, no icterus, swelling of the nose with nasal planning voice. Has a mild swelling all the left inferior peri orbital area, no erythema or tenderness. No sinus tenderness present. Examination of the nasal cavity shows no discharge however has significant hypertrophy of the nasal  turbinates. No obvious polyps seen. There is a mild swelling on the right upper lip. Tongue and oral cavity appears normal. Posterior pharyngeal wall appears normal. No lymphadenopathy. Cardiovascular: RRR, S1 normal, S2 normal, no MRG, pulses symmetric and intact bilaterally Pulmonary/Chest: CTAB, no wheezes, rales, or rhonchi Abdominal: Soft. Non-tender, non-distended, bowel sounds are normal, no masses, organomegaly, or guarding present.  GU: no CVA tenderness Musculoskeletal: No joint deformities, erythema, or stiffness, ROM full and no nontender Ext: no edema and no cyanosis, pulses palpable bilaterally (DP and PT) Hematology: no cervical, inginal, or axillary adenopathy.  Neurological: A&O x3, Strenght is normal and symmetric bilaterally, cranial nerve II-XII are grossly intact, no focal motor deficit, sensory intact to light touch bilaterally.  Skin: Warm, dry and intact. No rash, cyanosis, or clubbing.  Psychiatric: Normal mood and affect. speech and behavior is normal. Judgment and thought content normal. Cognition and memory are normal.   Labs on Admission:  Basic Metabolic  Panel:  Lab 03/15/12 1220  NA 139  K 3.5  CL 102  CO2 29  GLUCOSE 68*  BUN 16  CREATININE 1.20  CALCIUM 9.1  MG --  PHOS --   Liver Function Tests:  Lab 03/15/12 1220  AST 18  ALT 22  ALKPHOS 56  BILITOT 0.8  PROT 7.3  ALBUMIN 3.7   No results found for this basename: LIPASE:5,AMYLASE:5 in the last 168 hours No results found for this basename: AMMONIA:5 in the last 168 hours CBC:  Lab 03/15/12 1220  WBC 8.3  NEUTROABS  3.6  HGB 14.2  HCT 42.6  MCV 77.5*  PLT 265   Cardiac Enzymes: No results found for this basename: CKTOTAL:5,CKMB:5,CKMBINDEX:5,TROPONINI:5 in the last 168 hours BNP: No components found with this basename: POCBNP:5 CBG: No results found for this basename: GLUCAP:5 in the last 168 hours  Radiological Exams on Admission: Ct Maxillofacial Wo Cm  03/15/2012  *RADIOLOGY REPORT*  Clinical Data: Sinusitis.  Congestion.  CT MAXILLOFACIAL WITHOUT CONTRAST  Technique:  Multidetector CT imaging of the maxillofacial structures was performed. Multiplanar CT image reconstructions were also generated.  Comparison: None.  Findings: Pansinusitis is present.  There is complete opacification of both frontal sinuses.  All of the ethmoid air cells are completely opacified.  Minimal aeration of the left sphenoid sinus with near complete opacification of the right sphenoid sinus.  Near- complete opacification of both maxillary sinuses.  There is infiltration of the preseptal left periorbital soft tissues, suggesting preseptal periorbital cellulitis.  Grossly, intracranial contents are within normal limits.  Intracranial atherosclerosis is present.  The patient is edentulous.  Relatively high density of the opacification of the frontal and maxillary sinuses raises the possibility of allergic fungal sinusitis.  IMPRESSION: Pansinusitis.  Left medial periorbital soft tissue swelling/infiltration compatible with preseptal periorbital cellulitis.   Original Report Authenticated By: Andreas Newport, M.D.       Assessment/Plan Principal Problem:  *Periorbital cellulitis As evident on CT of the maxillofacial area. He does have mild swelling all the left and the orbital area although not prominent. There is no skin discoloration. I. will place him on IV Unasyn for now. Patient does not have any symptoms of pain or visual disturbance. Denies any fever or chills. -Monitor for any headaches, facial pain or visual  disturbances.  Active Problems:  Acute recurrent sinusitis Patient  has symptoms of recurrent chronic rhinosinusitis. He has the symptoms almost 4 times a year for past 3 years with each episode lasting anywhere from 3-4 days to 3-4 weeks. He was recently treated with a steroid taper and levofloxacin. I do not see any obvious polyp on exam. There is no maxillary tenderness on exam except for her enlarged turbinates and mild nasal smiling to swelling. I will restart him on IV Solu Medrol 40 every 8 hours. Added pseudoephidrine.add fluticasone nasal spray-Will get a CBC with differential, ESR, ANCA and serum ACE level. -Patient will need an ENT evaluation that can be done as outpatient.   Hypertension Stable continue with HCTZ. Patient was on lisinopril started past 2 months back and stopped about a week ago. He has has a right upper lip swelling that he noticed today. He doesn't have any tongue swelling or any difficulty. I am not sure if this is related to lisinopril allergy as he has stopped taking it for one week and his symptoms started developing only since today . I will however continue to hold lisinopril and would not restart it. He is already  on steroid and Benadryl. I will provide some Pepcid.   Asthma He informs having asthma symptoms associated with rhinosinusitis for past 3 years off and on. We'll continue him on albuterol nebs when necessary and albuterol inhalers. Continue with home does of Symbicort.   Diabetes mellitus Check A1c in the morning. He was noted to have a low blood glucose in the ED of 66 but a symptomatic. Will continue him on his home medications. Monitor FSG and sliding scale coverage.  DVT prophylaxis: Subcutaneous Lovenox  Diet: Diabetic  Code Status: Full code Family Communication: Discussed with patient and his wife at bedside Disposition Plan: Home once stable  Eddie North [facility] Pager [phone]  If 7PM-7AM, please contact  night-coverage [email or web address] Password [email or web address] 03/15/2012, 6:13 PM

## 2012-03-15 NOTE — Progress Notes (Signed)
1800 Patient [facility] [place]. Nontelemetry. MD aware for admission

## 2012-03-15 NOTE — ED Notes (Signed)
Pt. Reports both nares are clogged and he feels pressure on both sides of his nose.

## 2012-03-15 NOTE — ED Notes (Signed)
Family at bedside. 

## 2012-03-15 NOTE — ED Notes (Signed)
[facility] HAS BEEN CALLED FOR TRANSPORT

## 2012-03-15 NOTE — ED Notes (Signed)
Vital signs stable. 

## 2012-03-16 DIAGNOSIS — J329 Chronic sinusitis, unspecified: Secondary | ICD-10-CM

## 2012-03-16 DIAGNOSIS — L03211 Cellulitis of face: Principal | ICD-10-CM

## 2012-03-16 LAB — HEMOGLOBIN A1C
Hgb A1c MFr Bld: 7 % — ABNORMAL HIGH (ref <5.7)
Mean Plasma Glucose: 154 mg/dL — ABNORMAL HIGH (ref <117)

## 2012-03-16 LAB — BASIC METABOLIC PANEL
BUN: 16 mg/dL (ref 6–23)
Chloride: 99 mEq/L (ref 96–112)
Creatinine, Ser: 1.04 mg/dL (ref 0.50–1.35)
GFR calc Af Amer: 83 mL/min — ABNORMAL LOW (ref >90)

## 2012-03-16 LAB — GLUCOSE, CAPILLARY: Glucose-Capillary: 176 mg/dL — ABNORMAL HIGH (ref 70–99)

## 2012-03-16 LAB — ANCA SCREEN W REFLEX TITER
c-ANCA Screen: NEGATIVE
p-ANCA Screen: NEGATIVE

## 2012-03-16 LAB — MPO/PR-3 (ANCA) ANTIBODIES: Serine Protease 3: 4 AU/mL (ref <20)

## 2012-03-16 MED ORDER — AMOXICILLIN-POT CLAVULANATE 875-125 MG PO TABS: 1.0000 | ORAL_TABLET | Freq: Two times a day (BID) | ORAL | Status: DC

## 2012-03-16 MED ORDER — FLUTICASONE PROPIONATE 50 MCG/ACT NA SUSP: 2.0000 | Freq: Every day | NASAL | Status: DC

## 2012-03-16 NOTE — Progress Notes (Signed)
Travis Dalton to be D/C'd Home per MD order.  Discussed with the patient education handouts on sinusitis and orbital cellulitis and all questions fully answered.   [name], [name]  Home Medication Instructions [id number]   Printed on:03/16/12 1345  Medication Information                    montelukast (SINGULAIR) 10 MG tablet Take 10 mg by mouth at bedtime.           cetirizine (ZYRTEC) 10 MG tablet Take 10 mg by mouth at bedtime.            atorvastatin (LIPITOR) 40 MG tablet Take 20 mg by mouth daily.            budesonide-formoterol (SYMBICORT) 160-4.5 MCG/ACT inhaler Inhale 1 puff into the lungs 2 (two) times daily.            flunisolide (NASALIDE) 0.025 % SOLN Inhale 2 sprays into the lungs 2 (two) times daily as needed. For congestion           glipiZIDE (GLUCOTROL) 5 MG tablet Take 2.5 mg by mouth 2 (two) times daily before a meal.            metFORMIN (GLUCOPHAGE) 500 MG tablet Take 500 mg by mouth 2 (two) times daily with a meal.           lisinopril (PRINIVIL,ZESTRIL) 10 MG tablet Take 5 mg by mouth daily. Patient ran out of lisinopril on 03-12-2012  Has started taking some old HCTZ  He had at home until he could see doctor           hydrochlorothiazide (HYDRODIURIL) 25 MG tablet Take 12.5 mg by mouth daily.           guaiFENesin-codeine 100-10 MG/5ML syrup Take 10 mLs by mouth 4 (four) times daily as needed. For cough           Ibuprofen-Diphenhydramine HCl (ADVIL PM) 200-25 MG CAPS Take 2 tablets by mouth at bedtime as needed. To help rest           fish oil-omega-3 fatty acids 1000 MG capsule Take 1 g by mouth daily.           Multiple Vitamins-Minerals (MULTIVITAMIN WITH MINERALS) tablet Take 1 tablet by mouth daily.           predniSONE (STERAPRED UNI-PAK) 10 MG tablet DISP 12 DAY TAPER TAKE AS DIRECTED WITH FOOD last dose taken on 03-12-2012           amoxicillin-clavulanate (AUGMENTIN) 875-125 MG per tablet Take 1 tablet by mouth 2 (two)  times daily.           fluticasone (FLONASE) 50 MCG/ACT nasal spray Place 2 sprays into the nose daily.             VVS, Skin clean, dry and intact without evidence of skin break down, no evidence of skin tears noted. IV catheter discontinued intact. Site without signs and symptoms of complications. Dressing and pressure applied.  An After Visit Summary was printed and given to the patient.  New prescriptions and medication administration times given. Patient ambulated with family member, and D/C home via private auto.  Cindra Eves, RN 03/16/2012 1:45 PM

## 2012-03-16 NOTE — Discharge Summary (Signed)
Physician Discharge Summary  Azion Centrella IEP:[id number] DOB: [birth year] DOA: 03/15/2012  PCP: No primary provider on file.  Admit date: 03/15/2012 Discharge date: 03/16/2012  Recommendations for Outpatient Follow-up:  1. Follow up with his primary care doctor in 2 weeks.  Discharge Diagnoses:  Principal Problem:  *Periorbital cellulitis Active Problems:  Acute recurrent sinusitis  Hypertension  Asthma  Diabetes mellitus   Discharge Condition: Stable  Diet recommendation: Regular diet  Filed Weights   03/15/12 1542 03/15/12 1730  Weight: 88.451 kg (195 lb) 84.1 kg (185 lb 6.5 oz)    History of present illness:  66 year old pleasant African American male with history of hypertension, diabetes mellitus on oral hypoglycemics, hyperlipidemia with symptoms of recurrent rhinosinusitis and asthma-like symptoms for past 3 years was sent from [facility] [employer] [facility] for recurrent sinusitis. Patient informs that for past 3 years he has been having at least 4 episodes of nasal congestion and discharged with sinusitis-like symptoms that last anywhere from 3-4 days 3-4 weeks associated with some facial pain and whitish to yellowish nasal discharge and headaches. He denies any fevers or chills. Along with this she has been having asthma-like symptoms with some shortness of breath and wheezing. He has been treated multiple times with steroids, and nasal decongestant and antibiotics. He recently was prescribed a course of prednisone and levofloxacin that he completed. He presented to the ED today with symptoms off nasal congestion swelling of the nose and some swelling of the right upper lip more past 2 days. His wife also mentions noticing small amount of bleeding from the nose noticed since yesterday. Patient had a maxillofacial CT done in the ED which showed pansinusitis with left radial vital cellulitis and was transferred to [facility] for admission. My evaluation patient informs of nasal  congestion with myofascial pain without any fever, chills or headache. He denies any nausea, vomiting, shortness of breath, chest pain, palpitations, bowel or urinary symptoms. He usually follows at [facility] and has not seen any ENT in the past for his symptoms   Hospital Course:  Principal Problem: Periorbital cellulitis: -He was initially admitted on 03/15/2012 started on Unasyn steroids and Flonase. By the next day his swelling and redness was resolved. He relates he feels much better. So he was discharged on a seven-day course of Augmentin and to continue Flonase. He'll followup with his primary care doctor in 2 weeks.  Acute recurrent sinusitis -See above.  Hypertension -Stable no changes were made.  Asthma -No changes were made.  Diabetes mellitus -No changes were made  Procedures:  None (i.e. Studies not automatically included, echos, thoracentesis, etc; not x-rays)  Consultations:  NONE  Discharge Exam: Filed Vitals:   03/15/12 1542 03/15/12 1730 03/15/12 2200 03/16/12 0600  BP: 162/94 155/85 145/79 135/87  Pulse: 81 95 99 89  Temp:  97.9 F (36.6 C) 98.3 F (36.8 C) 98 F (36.7 C)  TempSrc:  Oral Oral Oral  Resp: 18 18 20 20   Height: 6' (1.829 m)     Weight: 88.451 kg (195 lb) 84.1 kg (185 lb 6.5 oz)    SpO2: 98% 97% 96% 95%    General: Awake alert and oriented x3 Cardiovascular: Regular rate and rhythm Respiratory: Good air movement clear to auscultation  Discharge Instructions  Discharge Orders    Future Orders Please Complete By Expires   Diet - low sodium heart healthy      Increase activity slowly          Medication List  As of 03/16/2012 11:31 AM    STOP taking these medications         levofloxacin 500 MG tablet   Commonly known as: LEVAQUIN      TAKE these medications         ADVIL PM 200-25 MG Caps   Generic drug: Ibuprofen-Diphenhydramine HCl   Take 2 tablets by mouth at bedtime as needed. To help rest       amoxicillin-clavulanate 875-125 MG per tablet   Commonly known as: AUGMENTIN   Take 1 tablet by mouth 2 (two) times daily.      atorvastatin 40 MG tablet   Commonly known as: LIPITOR   Take 20 mg by mouth daily.      budesonide-formoterol 160-4.5 MCG/ACT inhaler   Commonly known as: SYMBICORT   Inhale 1 puff into the lungs 2 (two) times daily.      cetirizine 10 MG tablet   Commonly known as: ZYRTEC   Take 10 mg by mouth at bedtime.      fish oil-omega-3 fatty acids 1000 MG capsule   Take 1 g by mouth daily.      flunisolide 0.025 % Soln   Commonly known as: NASALIDE   Inhale 2 sprays into the lungs 2 (two) times daily as needed. For congestion      fluticasone 50 MCG/ACT nasal spray   Commonly known as: FLONASE   Place 2 sprays into the nose daily.      glipiZIDE 5 MG tablet   Commonly known as: GLUCOTROL   Take 2.5 mg by mouth 2 (two) times daily before a meal.      guaiFENesin-codeine 100-10 MG/5ML syrup   Take 10 mLs by mouth 4 (four) times daily as needed. For cough      hydrochlorothiazide 25 MG tablet   Commonly known as: HYDRODIURIL   Take 12.5 mg by mouth daily.      lisinopril 10 MG tablet   Commonly known as: PRINIVIL,ZESTRIL   Take 5 mg by mouth daily. Patient ran out of lisinopril on 03-12-2012  Has started taking some old HCTZ  He had at home until he could see doctor      metFORMIN 500 MG tablet   Commonly known as: GLUCOPHAGE   Take 500 mg by mouth 2 (two) times daily with a meal.      montelukast 10 MG tablet   Commonly known as: SINGULAIR   Take 10 mg by mouth at bedtime.      multivitamin with minerals tablet   Take 1 tablet by mouth daily.      predniSONE 10 MG tablet   Commonly known as: STERAPRED UNI-PAK   DISP 12 DAY TAPER  TAKE AS DIRECTED WITH FOOD last dose taken on 03-12-2012          The results of significant diagnostics from this hospitalization (including imaging, microbiology, ancillary and laboratory) are listed below for  reference.    Significant Diagnostic Studies: Ct Maxillofacial Wo Cm  03/22/2012  *RADIOLOGY REPORT*  Clinical Data: Sinusitis.  Congestion.  CT MAXILLOFACIAL WITHOUT CONTRAST  Technique:  Multidetector CT imaging of the maxillofacial structures was performed. Multiplanar CT image reconstructions were also generated.  Comparison: None.  Findings: Pansinusitis is present.  There is complete opacification of both frontal sinuses.  All of the ethmoid air cells are completely opacified.  Minimal aeration of the left sphenoid sinus with near complete opacification of the right sphenoid sinus.  Near- complete opacification of both maxillary  sinuses.  There is infiltration of the preseptal left periorbital soft tissues, suggesting preseptal periorbital cellulitis.  Grossly, intracranial contents are within normal limits.  Intracranial atherosclerosis is present.  The patient is edentulous.  Relatively high density of the opacification of the frontal and maxillary sinuses raises the possibility of allergic fungal sinusitis.  IMPRESSION: Pansinusitis.  Left medial periorbital soft tissue swelling/infiltration compatible with preseptal periorbital cellulitis.   Original Report Authenticated By: Andreas Newport, M.D.     Microbiology: Recent Results (from the past 240 hour(s))  CULTURE, BLOOD (ROUTINE X 2)     Status: Normal (Preliminary result)   Collection Time   03/15/12  3:40 PM      Component Value Range Status Comment   Specimen Description BLOOD RIGHT ANTECUBITAL   Final    Special Requests     Final    Value: Normal BOTTLES DRAWN AEROBIC AND ANAEROBIC 5 CC EACH   Culture  Setup Time 03/15/2012 19:57   Final    Culture     Final    Value:        BLOOD CULTURE RECEIVED NO GROWTH TO DATE CULTURE WILL BE HELD FOR 5 DAYS BEFORE ISSUING A FINAL NEGATIVE REPORT   Report Status PENDING   Incomplete   CULTURE, BLOOD (ROUTINE X 2)     Status: Normal (Preliminary result)   Collection Time   03/15/12  3:40 PM       Component Value Range Status Comment   Specimen Description BLOOD RIGHT HAND   Final    Special Requests     Final    Value: Normal BOTTLES DRAWN AEROBIC AND ANAEROBIC 2 CC EACH   Culture  Setup Time 03/15/2012 19:57   Final    Culture     Final    Value:        BLOOD CULTURE RECEIVED NO GROWTH TO DATE CULTURE WILL BE HELD FOR 5 DAYS BEFORE ISSUING A FINAL NEGATIVE REPORT   Report Status PENDING   Incomplete      Labs: Basic Metabolic Panel:  Lab 03/16/12 1610 03/15/12 1220  NA 136 139  K 4.0 3.5  CL 99 102  CO2 26 29  GLUCOSE 276* 68*  BUN 16 16  CREATININE 1.04 1.20  CALCIUM 9.2 9.1  MG -- --  PHOS -- --   Liver Function Tests:  Lab 03/15/12 1220  AST 18  ALT 22  ALKPHOS 56  BILITOT 0.8  PROT 7.3  ALBUMIN 3.7   No results found for this basename: LIPASE:5,AMYLASE:5 in the last 168 hours No results found for this basename: AMMONIA:5 in the last 168 hours CBC:  Lab 03/15/12 2004 03/15/12 1220  WBC 9.5 8.3  NEUTROABS 8.5* 3.6  HGB 14.1 14.2  HCT 42.1 42.6  MCV 78.1 77.5*  PLT 273 265   Cardiac Enzymes: No results found for this basename: CKTOTAL:5,CKMB:5,CKMBINDEX:5,TROPONINI:5 in the last 168 hours BNP: BNP (last 3 results) No results found for this basename: PROBNP:3 in the last 8760 hours CBG:  Lab 03/16/12 0807 03/15/12 2231 03/15/12 1837  GLUCAP 292* 326* 230*    Time coordinating discharge: *30 minutes  Signed:  Marinda Elk  [facility] 03/16/2012, 11:31 AM

## 2012-03-16 NOTE — Progress Notes (Signed)
Clinical Social Work  CSW notarized packet for patient. Original copy given to patient. Copy placed in chart. CSW is signing off.  [place], [place] [phone]

## 2012-03-16 NOTE — Progress Notes (Signed)
Clinical Social Work Department BRIEF PSYCHOSOCIAL ASSESSMENT 03/16/2012  Patient:  Travis Dalton, Travis Dalton     Account Number:  [id number]     Admit date:  03/15/2012  Clinical Social Worker:  Dennison Bulla  Date/Time:  03/16/2012 12:00 N  Referred by:  Physician  Date Referred:  03/16/2012 Referred for  Advanced Directives   Other Referral:   Interview type:  Patient Other interview type:    PSYCHOSOCIAL DATA Living Status:  FAMILY Admitted from facility:   Level of care:   Primary support name:  [place] Primary support relationship to patient:  SPOUSE Degree of support available:   Strong    CURRENT CONCERNS Current Concerns  Other - See comment   Other Concerns:   Advanced directives    SOCIAL WORK ASSESSMENT / PLAN CSW received referral to assist with advanced directives. CSW reviewed chart and met with patient at dc. No visitors were present.    CSW introduced myself and explained role. CSW explained advanced directives. Patient reports that he was not interested at first but wife inquired about more information. CSW provided patient with packet and explained process. Patient reports that he desires to talk with wife regarding packet before filling out information. CSW explained that CSW could notarize or gave options in the community that notarize if needed.    CSW left contact information with patient. CSW is signing off but available if needed.   Assessment/plan status:  No Further Intervention Required Other assessment/ plan:   Information/referral to community resources:   Advanced directives packet    PATIENT'S/FAMILY'S RESPONSE TO PLAN OF CARE: Patient alert and oriented. Patient receptive to CSW consult and thanked CSW for information.

## 2012-03-21 LAB — CULTURE, BLOOD (ROUTINE X 2)
Culture: NO GROWTH
Special Requests: NORMAL
Special Requests: NORMAL

## 2012-10-19 ENCOUNTER — Encounter (HOSPITAL_COMMUNITY): Payer: Self-pay | Admitting: Emergency Medicine

## 2012-10-19 ENCOUNTER — Emergency Department (HOSPITAL_COMMUNITY)
Admission: EM | Admit: 2012-10-19 | Discharge: 2012-10-19 | Disposition: A | Discharge status: Home / Self Care | Payer: Medicare Other | Source: Home / Self Care | Attending: Family Medicine | Admitting: Family Medicine

## 2012-10-19 DIAGNOSIS — J309 Allergic rhinitis, unspecified: Secondary | ICD-10-CM

## 2012-10-19 DIAGNOSIS — J302 Other seasonal allergic rhinitis: Secondary | ICD-10-CM

## 2012-10-19 MED ORDER — TRIAMCINOLONE ACETONIDE 40 MG/ML IJ SUSP
40.0000 mg | Freq: Once | INTRAMUSCULAR | Status: AC
  Administered 2012-10-19: 40 mg via INTRAMUSCULAR

## 2012-10-19 MED ORDER — AMOXICILLIN-POT CLAVULANATE 875-125 MG PO TABS: 1.0000 | ORAL_TABLET | Freq: Two times a day (BID) | ORAL | Status: DC

## 2012-10-19 MED ORDER — FLUTICASONE PROPIONATE 50 MCG/ACT NA SUSP: 1.0000 | Freq: Two times a day (BID) | NASAL | Status: DC

## 2012-10-19 MED ORDER — METHYLPREDNISOLONE ACETATE 40 MG/ML IJ SUSP
80.0000 mg | Freq: Once | INTRAMUSCULAR | Status: AC
  Administered 2012-10-19: 80 mg via INTRAMUSCULAR

## 2012-10-19 MED ORDER — TRIAMCINOLONE ACETONIDE 40 MG/ML IJ SUSP
INTRAMUSCULAR | Status: AC
  Filled 2012-10-19: qty 5

## 2012-10-19 MED ORDER — METHYLPREDNISOLONE ACETATE 80 MG/ML IJ SUSP
INTRAMUSCULAR | Status: AC
  Filled 2012-10-19: qty 1

## 2012-10-19 NOTE — ED Provider Notes (Signed)
History     CSN: [id number]  Arrival date & time 10/19/12  1053   First MD Initiated Contact with Patient 10/19/12 1153      Chief Complaint  Patient presents with  . Recurrent Sinusitis    (Consider location/radiation/quality/duration/timing/severity/associated sxs/prior treatment) Patient is a 67 y.o. male presenting with URI. The history is provided by the patient.  URI Presenting symptoms: congestion, cough and rhinorrhea   Presenting symptoms: no fever   Severity:  Moderate Onset quality:  Gradual Duration:  2 weeks Progression:  Worsening Chronicity:  New Ineffective treatments:  Prescription medications (amox given at [facility] clinic) Associated symptoms: headaches   Associated symptoms comment:  Insomnia  Risk factors: being elderly     Past Medical History  Diagnosis Date  . Hypertension   . Multiple allergies   . Asthma   . Sinusitis   . High cholesterol   . COPD (chronic obstructive pulmonary disease)   . Chronic bronchitis   . Shortness of breath     "occasionally; at any time; related to my asthma" (03/15/2012)  . Type II diabetes mellitus   . Inguinal hernia     unrepaired; left groin (03/15/2012)  . PTSD (post-traumatic stress disorder)     "40%" (03/15/2012)  . Periorbital cellulitis 03/15/2012    Past Surgical History  Procedure Laterality Date  . Shoulder arthroscopy w/ rotator cuff repair  ~ 2000    right    No family history on file.  History  Substance Use Topics  . Smoking status: Never Smoker   . Smokeless tobacco: Never Used  . Alcohol Use: No      Review of Systems  Constitutional: Negative.  Negative for fever.  HENT: Positive for congestion, rhinorrhea, postnasal drip and sinus pressure.   Respiratory: Positive for cough.   Cardiovascular: Negative.   Gastrointestinal: Negative.   Neurological: Positive for headaches.    Allergies  Review of patient's allergies indicates no known allergies.  Home Medications    Current Outpatient Rx  Name  Route  Sig  Dispense  Refill  . amoxicillin-clavulanate (AUGMENTIN) 875-125 MG per tablet   Oral   Take 1 tablet by mouth 2 (two) times daily.   14 tablet   0   . atorvastatin (LIPITOR) 40 MG tablet   Oral   Take 20 mg by mouth daily.          . budesonide-formoterol (SYMBICORT) 160-4.5 MCG/ACT inhaler   Inhalation   Inhale 1 puff into the lungs 2 (two) times daily.          . cetirizine (ZYRTEC) 10 MG tablet   Oral   Take 10 mg by mouth at bedtime.          . fish oil-omega-3 fatty acids 1000 MG capsule   Oral   Take 1 g by mouth daily.         . flunisolide (NASALIDE) 0.025 % SOLN   Inhalation   Inhale 2 sprays into the lungs 2 (two) times daily as needed. For congestion         . fluticasone (FLONASE) 50 MCG/ACT nasal spray   Nasal   Place 2 sprays into the nose daily.   1 g   0   . glipiZIDE (GLUCOTROL) 5 MG tablet   Oral   Take 2.5 mg by mouth 2 (two) times daily before a meal.          . guaiFENesin-codeine 100-10 MG/5ML syrup   Oral   Take  10 mLs by mouth 4 (four) times daily as needed. For cough         . hydrochlorothiazide (HYDRODIURIL) 25 MG tablet   Oral   Take 12.5 mg by mouth daily.         . Ibuprofen-Diphenhydramine HCl (ADVIL PM) 200-25 MG CAPS   Oral   Take 2 tablets by mouth at bedtime as needed. To help rest         . lisinopril (PRINIVIL,ZESTRIL) 10 MG tablet   Oral   Take 5 mg by mouth daily. Patient ran out of lisinopril on 03-12-2012  Has started taking some old HCTZ  He had at home until he could see doctor         . metFORMIN (GLUCOPHAGE) 500 MG tablet   Oral   Take 500 mg by mouth 2 (two) times daily with a meal.         . montelukast (SINGULAIR) 10 MG tablet   Oral   Take 10 mg by mouth at bedtime.         . Multiple Vitamins-Minerals (MULTIVITAMIN WITH MINERALS) tablet   Oral   Take 1 tablet by mouth daily.         . predniSONE (STERAPRED UNI-PAK) 10 MG tablet       DISP 12 DAY TAPER TAKE AS DIRECTED WITH FOOD last dose taken on 03-12-2012           BP 157/98  Pulse 80  Temp(Src) 97.5 F (36.4 C) (Oral)  Resp 20  SpO2 96%  Physical Exam  Nursing note and vitals reviewed. Constitutional: He is oriented to person, place, and time. He appears well-developed and well-nourished.  HENT:  Head: Normocephalic.  Right Ear: External ear normal.  Left Ear: External ear normal.  Nose: Mucosal edema and rhinorrhea present. Right sinus exhibits maxillary sinus tenderness. Left sinus exhibits maxillary sinus tenderness.  Mouth/Throat: Oropharynx is clear and moist.  Neck: Normal range of motion. Neck supple.  Cardiovascular: Regular rhythm.   Pulmonary/Chest: Effort normal and breath sounds normal.  Lymphadenopathy:    He has no cervical adenopathy.  Neurological: He is alert and oriented to person, place, and time.  Skin: Skin is warm and dry.    ED Course  Procedures (including critical care time)  Labs Reviewed - No data to display No results found.   No diagnosis found.    MDM          Linna Hoff, MD 10/19/12 1218

## 2012-10-19 NOTE — ED Notes (Signed)
Pt c/o recurrent sinus infections onset 2 weeks; saw PCP at the [facility] last week and was given Amox but not helping Has appt at [facility] Tuesday Sx today include: nasal congestion, dyspnea at night, cough w/yellow mucous, facial pressure and headache Denies: f/v/n/d  He is alert and oriented w/no signs of acute distress.

## 2013-10-19 ENCOUNTER — Emergency Department (HOSPITAL_COMMUNITY)
Admission: EM | Admit: 2013-10-19 | Discharge: 2013-10-20 | Disposition: A | Discharge status: Home / Self Care | Payer: Medicare Other | Attending: Emergency Medicine | Admitting: Emergency Medicine

## 2013-10-19 ENCOUNTER — Emergency Department (HOSPITAL_COMMUNITY): Payer: Medicare Other

## 2013-10-19 ENCOUNTER — Encounter (HOSPITAL_COMMUNITY): Payer: Self-pay | Admitting: Emergency Medicine

## 2013-10-19 DIAGNOSIS — Z8719 Personal history of other diseases of the digestive system: Secondary | ICD-10-CM | POA: Insufficient documentation

## 2013-10-19 DIAGNOSIS — Z79899 Other long term (current) drug therapy: Secondary | ICD-10-CM | POA: Insufficient documentation

## 2013-10-19 DIAGNOSIS — G479 Sleep disorder, unspecified: Secondary | ICD-10-CM | POA: Insufficient documentation

## 2013-10-19 DIAGNOSIS — Z8659 Personal history of other mental and behavioral disorders: Secondary | ICD-10-CM | POA: Insufficient documentation

## 2013-10-19 DIAGNOSIS — J329 Chronic sinusitis, unspecified: Secondary | ICD-10-CM

## 2013-10-19 DIAGNOSIS — Z792 Long term (current) use of antibiotics: Secondary | ICD-10-CM | POA: Insufficient documentation

## 2013-10-19 DIAGNOSIS — R61 Generalized hyperhidrosis: Secondary | ICD-10-CM | POA: Insufficient documentation

## 2013-10-19 DIAGNOSIS — E7800 Pure hypercholesterolemia, unspecified: Secondary | ICD-10-CM | POA: Insufficient documentation

## 2013-10-19 DIAGNOSIS — J449 Chronic obstructive pulmonary disease, unspecified: Secondary | ICD-10-CM | POA: Insufficient documentation

## 2013-10-19 DIAGNOSIS — J4489 Other specified chronic obstructive pulmonary disease: Secondary | ICD-10-CM | POA: Insufficient documentation

## 2013-10-19 DIAGNOSIS — IMO0002 Reserved for concepts with insufficient information to code with codable children: Secondary | ICD-10-CM | POA: Insufficient documentation

## 2013-10-19 DIAGNOSIS — I10 Essential (primary) hypertension: Secondary | ICD-10-CM | POA: Insufficient documentation

## 2013-10-19 DIAGNOSIS — Z8669 Personal history of other diseases of the nervous system and sense organs: Secondary | ICD-10-CM | POA: Insufficient documentation

## 2013-10-19 DIAGNOSIS — E119 Type 2 diabetes mellitus without complications: Secondary | ICD-10-CM | POA: Insufficient documentation

## 2013-10-19 LAB — CBC WITH DIFFERENTIAL/PLATELET
BASOS PCT: 1 % (ref 0–1)
Basophils Absolute: 0.1 10*3/uL (ref 0.0–0.1)
Eosinophils Absolute: 0.5 10*3/uL (ref 0.0–0.7)
Eosinophils Relative: 9 % — ABNORMAL HIGH (ref 0–5)
HCT: 40.5 % (ref 39.0–52.0)
HEMOGLOBIN: 13.3 g/dL (ref 13.0–17.0)
Lymphocytes Relative: 37 % (ref 12–46)
Lymphs Abs: 2.3 10*3/uL (ref 0.7–4.0)
MCH: 26.8 pg (ref 26.0–34.0)
MCHC: 32.8 g/dL (ref 30.0–36.0)
MCV: 81.7 fL (ref 78.0–100.0)
MONOS PCT: 8 % (ref 3–12)
Monocytes Absolute: 0.5 10*3/uL (ref 0.1–1.0)
NEUTROS ABS: 2.8 10*3/uL (ref 1.7–7.7)
Neutrophils Relative %: 45 % (ref 43–77)
PLATELETS: 256 10*3/uL (ref 150–400)
RBC: 4.96 MIL/uL (ref 4.22–5.81)
RDW: 13.6 % (ref 11.5–15.5)
WBC: 6.2 10*3/uL (ref 4.0–10.5)

## 2013-10-19 LAB — BASIC METABOLIC PANEL
BUN: 15 mg/dL (ref 6–23)
CHLORIDE: 101 meq/L (ref 96–112)
CO2: 24 mEq/L (ref 19–32)
Calcium: 8.9 mg/dL (ref 8.4–10.5)
Creatinine, Ser: 1.09 mg/dL (ref 0.50–1.35)
GFR calc non Af Amer: 67 mL/min — ABNORMAL LOW (ref >90)
GFR, EST AFRICAN AMERICAN: 77 mL/min — AB (ref >90)
Glucose, Bld: 125 mg/dL — ABNORMAL HIGH (ref 70–99)
POTASSIUM: 3.9 meq/L (ref 3.7–5.3)
SODIUM: 138 meq/L (ref 137–147)

## 2013-10-19 LAB — CBG MONITORING, ED: Glucose-Capillary: 136 mg/dL — ABNORMAL HIGH (ref 70–99)

## 2013-10-19 MED ORDER — DEXTROSE 5 % IV SOLN
1.0000 g | Freq: Once | INTRAVENOUS | Status: AC
  Administered 2013-10-19: 1 g via INTRAVENOUS
  Filled 2013-10-19: qty 10

## 2013-10-19 MED ORDER — SODIUM CHLORIDE 0.9 % IV BOLUS (SEPSIS)
1000.0000 mL | INTRAVENOUS | Status: AC
  Administered 2013-10-19: 1000 mL via INTRAVENOUS

## 2013-10-19 NOTE — ED Notes (Signed)
Pt at ED tonight for nasal congestion, unable to breath out of nose and hasn't slept in several days d/t congestion.  Taking Sudafed with no relief.  Has chronic sinusitis and is taking monthly injections at [facility].  Had first shot 10-11-13 and was told by shot 3 symptoms should start improving.  Pt is worried about BS and BP.

## 2013-10-19 NOTE — ED Notes (Signed)
Pt states that he is here for a sinus infection that has been going on for a year. [facility] (unknown name by him) every month for the sinus infection. Pt has been taking prednisone and Sudafed for the sinus infection. Pt is worried about his BP and Blood sugar being elevated. Pt also feels stopped up.

## 2013-10-19 NOTE — ED Provider Notes (Signed)
CSN: [id number]     Arrival date & time 10/19/13  2038 History  This chart was scribed for Travis BeckKaitlyn Handy Mcloud, PA by Evon Slackerrance Branch, ED Scribe. This patient was seen in room TR09C/TR09C and the patient's care was started at 9:39 PM.      Chief Complaint  Patient presents with  . Recurrent Sinusitis   The history is provided by the patient. No language interpreter was used.   HPI Comments: Travis Dalton is a 68 y.o. male who presents to the Emergency Department complaining of chronic sinus infection onset for the past year. He has associated diaphoresis and sleep disturbance. He states that urgent care sent him to the ED because of the concern that the swelling was too close his brain. Pt stats that he has recently started taking a shot for his symptoms. He states that he has been told he would recieve 3 of these shots before his symptoms improve. Pt is worried about his blood pressure and blood sugar levels being elevated. Pt states that he has been taking Sudafed and prednisone with no relief to his symptoms.   Past Medical History  Diagnosis Date  . Hypertension   . Multiple allergies   . Asthma   . Sinusitis   . High cholesterol   . COPD (chronic obstructive pulmonary disease)   . Chronic bronchitis   . Shortness of breath     "occasionally; at any time; related to my asthma" (03/15/2012)  . Type II diabetes mellitus   . Inguinal hernia     unrepaired; left groin (03/15/2012)  . PTSD (post-traumatic stress disorder)     "40%" (03/15/2012)  . Periorbital cellulitis 03/15/2012   Past Surgical History  Procedure Laterality Date  . Shoulder arthroscopy w/ rotator cuff repair  ~ 2000    right   History reviewed. No pertinent family history. History  Substance Use Topics  . Smoking status: Never Smoker   . Smokeless tobacco: Never Used  . Alcohol Use: No    Review of Systems  Constitutional: Positive for diaphoresis.  HENT: Positive for congestion and sinus pressure.    Psychiatric/Behavioral: Positive for sleep disturbance.      Allergies  Review of patient's allergies indicates no known allergies.  Home Medications   Prior to Admission medications   Medication Sig Start Date End Date Taking? Authorizing Provider  amoxicillin-clavulanate (AUGMENTIN) 875-125 MG per tablet Take 1 tablet by mouth 2 (two) times daily. 03/16/12   Marinda ElkAbraham Feliz Ortiz, MD  amoxicillin-clavulanate (AUGMENTIN) 875-125 MG per tablet Take 1 tablet by mouth 2 (two) times daily. 10/19/12   Linna HoffJames D Kindl, MD  atorvastatin (LIPITOR) 40 MG tablet Take 20 mg by mouth daily.     Historical Provider, MD  budesonide-formoterol (SYMBICORT) 160-4.5 MCG/ACT inhaler Inhale 1 puff into the lungs 2 (two) times daily.     Historical Provider, MD  cetirizine (ZYRTEC) 10 MG tablet Take 10 mg by mouth at bedtime.     Historical Provider, MD  fish oil-omega-3 fatty acids 1000 MG capsule Take 1 g by mouth daily.    Historical Provider, MD  flunisolide (NASALIDE) 0.025 % SOLN Inhale 2 sprays into the lungs 2 (two) times daily as needed. For congestion    Historical Provider, MD  fluticasone (FLONASE) 50 MCG/ACT nasal spray Place 2 sprays into the nose daily. 03/16/12   Marinda ElkAbraham Feliz Ortiz, MD  fluticasone (FLONASE) 50 MCG/ACT nasal spray Place 1 spray into the nose 2 (two) times daily. 10/19/12  Linna HoffJames D Kindl, MD  glipiZIDE (GLUCOTROL) 5 MG tablet Take 2.5 mg by mouth 2 (two) times daily before a meal.     Historical Provider, MD  guaiFENesin-codeine 100-10 MG/5ML syrup Take 10 mLs by mouth 4 (four) times daily as needed. For cough    Historical Provider, MD  hydrochlorothiazide (HYDRODIURIL) 25 MG tablet Take 12.5 mg by mouth daily.    Historical Provider, MD  Ibuprofen-Diphenhydramine HCl (ADVIL PM) 200-25 MG CAPS Take 2 tablets by mouth at bedtime as needed. To help rest    Historical Provider, MD  lisinopril (PRINIVIL,ZESTRIL) 10 MG tablet Take 5 mg by mouth daily. Patient ran out of lisinopril on  03-12-2012  Has started taking some old HCTZ  He had at home until he could see doctor    Historical Provider, MD  metFORMIN (GLUCOPHAGE) 500 MG tablet Take 500 mg by mouth 2 (two) times daily with a meal.    Historical Provider, MD  montelukast (SINGULAIR) 10 MG tablet Take 10 mg by mouth at bedtime.    Historical Provider, MD  Multiple Vitamins-Minerals (MULTIVITAMIN WITH MINERALS) tablet Take 1 tablet by mouth daily.    Historical Provider, MD  predniSONE (STERAPRED UNI-PAK) 10 MG tablet DISP 12 DAY TAPER TAKE AS DIRECTED WITH FOOD last dose taken on 03-12-2012 09/15/11   Randa SpikeKimberly G Lykins, DO   Triage Vitals: BP 167/93  Pulse 92  Temp(Src) 97.8 F (36.6 C) (Oral)  Resp 20  Wt 189 lb (85.73 kg)  SpO2 98%  Physical Exam  Nursing note and vitals reviewed. Constitutional: He is oriented to person, place, and time. He appears well-developed and well-nourished. No distress.  HENT:  Head: Normocephalic and atraumatic.  Bilateral nares congested with no patency, maxillary sinus tenderness to palpation.  Eyes: EOM are normal.  Neck: Neck supple. No tracheal deviation present.  Cardiovascular: Normal rate.   Pulmonary/Chest: Effort normal. No respiratory distress.  Musculoskeletal: Normal range of motion.  Neurological: He is alert and oriented to person, place, and time.  Skin: Skin is warm and dry.  Psychiatric: He has a normal mood and affect. His behavior is normal.    ED Course  Procedures (including critical care time) DIAGNOSTIC STUDIES: Oxygen Saturation is 98% on RA, normal by my interpretation.    COORDINATION OF CARE: 9:45 PM-Discussed treatment plan which includes Maxillofacial CT scan, and BBC panel with pt at bedside and pt agreed to plan.     Labs Review Labs Reviewed  CBC WITH DIFFERENTIAL - Abnormal; Notable for the following:    Eosinophils Relative 9 (*)    All other components within normal limits  BASIC METABOLIC PANEL - Abnormal; Notable for the following:     Glucose, Bld 125 (*)    GFR calc non Af Amer 67 (*)    GFR calc Af Amer 77 (*)    All other components within normal limits  CBG MONITORING, ED - Abnormal; Notable for the following:    Glucose-Capillary 136 (*)    All other components within normal limits    Imaging Review Ct Maxillofacial Wo Cm  10/19/2013   CLINICAL DATA:  History of recurrent sinusitis  EXAM: CT MAXILLOFACIAL WITHOUT CONTRAST  TECHNIQUE: Multidetector CT imaging of the maxillofacial structures was performed. Multiplanar CT image reconstructions were also generated. A small metallic BB was placed on the right temple in order to reliably differentiate right from left.  COMPARISON:  CT MAXILLOFACIAL W/O CM dated 03/15/2012  FINDINGS: There is opacification of the frontal, ethmoid,  right sphenoid, and right maxillary sinuses. There is partial opacification of the left sphenoid and left maxillary sinuses. There is soft tissue throughout the nasal passages. The nasal septum is mildly deviated towards the right. The mastoid air cells are well pneumatized.  The globes exhibit normal density and contour. There is no pre or postseptal edema. The intraconal and extraconal soft tissues are normal in appearance.  The bony orbits and the sinus walls appear intact. The zygomatic arches and pterygoid plates appear normal. The temporomandibular joints and the mandible appear normal.  IMPRESSION: 1. There is pansinus inflammation. The nasal passages are obstructed by soft tissue density material. There is no evidence of expansile remodeling of the bony confines of the paranasal sinuses. 2. There is no abnormality of the orbital structures. 3. The facial bones exhibit no acute abnormalities.   Electronically Signed   By: David  [place]   On: 10/19/2013 23:20     EKG Interpretation None      MDM   Final diagnoses:  Recurrent sinusitis   1:34 AM CT and labs unremarkable for acute changes. Patient given IV rocephin here. Vitals stable and  patient afebrile. Patient will be discharged with Bactrim.   I personally performed the services described in this documentation, which was scribed in my presence. The recorded information has been reviewed and is accurate.      Travis Dalton, [place] 10/20/13 [phone]

## 2013-10-20 MED ORDER — AMOXICILLIN-POT CLAVULANATE 875-125 MG PO TABS: 1.0000 | ORAL_TABLET | Freq: Two times a day (BID) | ORAL | Status: DC

## 2013-10-20 MED ORDER — SULFAMETHOXAZOLE-TRIMETHOPRIM 800-160 MG PO TABS: 1.0000 | ORAL_TABLET | Freq: Two times a day (BID) | ORAL | Status: DC

## 2013-10-20 NOTE — Discharge Instructions (Signed)
Take Augmentin as directed until gone. Refer to attached documents for more information. Return to the ED with worsening or concerning symptoms.  °

## 2013-10-21 NOTE — ED Provider Notes (Signed)
Medical screening examination/treatment/procedure(s) were performed by non-physician practitioner and as supervising physician I was immediately available for consultation/collaboration.   EKG Interpretation None       Juliet RudeNathan R. Rubin PayorPickering, MD 10/21/13 0000

## 2013-10-28 ENCOUNTER — Emergency Department (HOSPITAL_COMMUNITY): Payer: Medicare Other

## 2013-10-28 ENCOUNTER — Emergency Department (HOSPITAL_COMMUNITY)
Admission: EM | Admit: 2013-10-28 | Discharge: 2013-10-28 | Disposition: A | Discharge status: Home / Self Care | Payer: Medicare Other | Attending: Emergency Medicine | Admitting: Emergency Medicine

## 2013-10-28 ENCOUNTER — Encounter (HOSPITAL_COMMUNITY): Payer: Self-pay | Admitting: Emergency Medicine

## 2013-10-28 DIAGNOSIS — Z8659 Personal history of other mental and behavioral disorders: Secondary | ICD-10-CM | POA: Insufficient documentation

## 2013-10-28 DIAGNOSIS — Z8669 Personal history of other diseases of the nervous system and sense organs: Secondary | ICD-10-CM | POA: Insufficient documentation

## 2013-10-28 DIAGNOSIS — Z79899 Other long term (current) drug therapy: Secondary | ICD-10-CM | POA: Insufficient documentation

## 2013-10-28 DIAGNOSIS — J329 Chronic sinusitis, unspecified: Secondary | ICD-10-CM | POA: Insufficient documentation

## 2013-10-28 DIAGNOSIS — J441 Chronic obstructive pulmonary disease with (acute) exacerbation: Secondary | ICD-10-CM | POA: Insufficient documentation

## 2013-10-28 DIAGNOSIS — I10 Essential (primary) hypertension: Secondary | ICD-10-CM | POA: Insufficient documentation

## 2013-10-28 DIAGNOSIS — J45901 Unspecified asthma with (acute) exacerbation: Secondary | ICD-10-CM

## 2013-10-28 DIAGNOSIS — IMO0002 Reserved for concepts with insufficient information to code with codable children: Secondary | ICD-10-CM | POA: Insufficient documentation

## 2013-10-28 DIAGNOSIS — Z8719 Personal history of other diseases of the digestive system: Secondary | ICD-10-CM | POA: Insufficient documentation

## 2013-10-28 DIAGNOSIS — E7800 Pure hypercholesterolemia, unspecified: Secondary | ICD-10-CM | POA: Insufficient documentation

## 2013-10-28 DIAGNOSIS — E119 Type 2 diabetes mellitus without complications: Secondary | ICD-10-CM | POA: Insufficient documentation

## 2013-10-28 MED ORDER — FLUTICASONE PROPIONATE 50 MCG/ACT NA SUSP: 2.0000 | Freq: Every day | NASAL | Status: DC

## 2013-10-28 MED ORDER — FLUTICASONE PROPIONATE 50 MCG/ACT NA SUSP
2.0000 | Freq: Every day | NASAL | Status: DC
  Administered 2013-10-28: 2 via NASAL
  Filled 2013-10-28: qty 16

## 2013-10-28 NOTE — ED Notes (Signed)
PT reports clear drainage from nares bilat and denies cough/sputum

## 2013-10-28 NOTE — ED Notes (Addendum)
Pt reports being seen here on 5/16 for sob and diagnosed with sinusitis and given po anitbiotics, reports no relief and still having sob (mainly nasal congestion), unable to sleep at night. No acute distress noted, airway intact, ekg done at triage.

## 2013-10-28 NOTE — ED Notes (Signed)
PT has been being treated for intermittent SOB for the past year and states his nose has been stuffed for the past 2-3 days. PT reports polyps in nose.

## 2013-10-28 NOTE — ED Provider Notes (Signed)
CSN: [id number]     Arrival date & time 10/28/13  1701 History   First MD Initiated Contact with Patient 10/28/13 2122     Chief Complaint  Patient presents with  . Shortness of Breath     (Consider location/radiation/quality/duration/timing/severity/associated sxs/prior Treatment) HPI  Patient to the ER with complaints of sinus congestion for 7-8 years but worse in the past year. He reports having been seen numerous times in the past year and has been on steroids, nasal sprays, allergy medications, "every" antibiotic but nothing has helped. He was seen at the Urgent care approx 9 days ago and they sent him to the hospital to make sure that the infection was not getting close to his brain. He had a CT scan that showed pansinusitis. He was given Rocephin IV and Bactrim for home, it did not help. He reports polyps in his nose and being able to breath or smell out of it. He denies having any fevers, SOB, CP, weakness, or new symptoms. No headache, weakness or confusion.  Past Medical History  Diagnosis Date  . Hypertension   . Multiple allergies   . Asthma   . Sinusitis   . High cholesterol   . COPD (chronic obstructive pulmonary disease)   . Chronic bronchitis   . Shortness of breath     "occasionally; at any time; related to my asthma" (03/15/2012)  . Type II diabetes mellitus   . Inguinal hernia     unrepaired; left groin (03/15/2012)  . PTSD (post-traumatic stress disorder)     "40%" (03/15/2012)  . Periorbital cellulitis 03/15/2012   Past Surgical History  Procedure Laterality Date  . Shoulder arthroscopy w/ rotator cuff repair  ~ 2000    right   History reviewed. No pertinent family history. History  Substance Use Topics  . Smoking status: Never Smoker   . Smokeless tobacco: Never Used  . Alcohol Use: No    Review of Systems   Review of Systems  Gen: no weight loss, fevers, chills, night sweats  Eyes: no discharge or drainage, no occular pain or visual changes   Nose: no epistaxis, + chronic congestion Mouth: no dental pain, no sore throat  Neck: no neck pain  Lungs:No wheezing, coughing or hemoptysis CV: no chest pain, palpitations, dependent edema or orthopnea  Abd: no abdominal pain, nausea, vomiting, diarrhea GU: no dysuria or gross hematuria  MSK:  No muscle weakness or pain Neuro: no headache, no focal neurologic deficits  Skin: no rash or wounds Psyche: no complaints    Allergies  Review of patient's allergies indicates no known allergies.  Home Medications   Prior to Admission medications   Medication Sig Start Date End Date Taking? Authorizing Provider  atorvastatin (LIPITOR) 40 MG tablet Take 20 mg by mouth daily.    Yes Historical Provider, MD  budesonide-formoterol (SYMBICORT) 160-4.5 MCG/ACT inhaler Inhale 1 puff into the lungs 2 (two) times daily.    Yes Historical Provider, MD  cetirizine (ZYRTEC) 10 MG tablet Take 10 mg by mouth at bedtime.    Yes Historical Provider, MD  fish oil-omega-3 fatty acids 1000 MG capsule Take 1 g by mouth daily.   Yes Historical Provider, MD  flunisolide (NASALIDE) 0.025 % SOLN Inhale 2 sprays into the lungs 2 (two) times daily as needed. For congestion   Yes Historical Provider, MD  fluticasone (FLONASE) 50 MCG/ACT nasal spray Place 2 sprays into the nose daily. 03/16/12  Yes Marinda ElkAbraham Feliz Ortiz, MD  hydrochlorothiazide (HYDRODIURIL) 25 MG  tablet Take 12.5 mg by mouth daily.   Yes Historical Provider, MD  Ibuprofen-Diphenhydramine HCl (ADVIL PM) 200-25 MG CAPS Take 2 tablets by mouth at bedtime as needed. To help rest   Yes Historical Provider, MD  lisinopril (PRINIVIL,ZESTRIL) 10 MG tablet Take 5 mg by mouth daily. Patient ran out of lisinopril on 03-12-2012  Has started taking some old HCTZ  He had at home until he could see doctor   Yes Historical Provider, MD  metFORMIN (GLUCOPHAGE) 500 MG tablet Take 500 mg by mouth 2 (two) times daily with a meal.   Yes Historical Provider, MD  montelukast  (SINGULAIR) 10 MG tablet Take 10 mg by mouth at bedtime.   Yes Historical Provider, MD  Multiple Vitamins-Minerals (MULTIVITAMIN WITH MINERALS) tablet Take 1 tablet by mouth daily.   Yes Historical Provider, MD  sulfamethoxazole-trimethoprim (SEPTRA DS) 800-160 MG per tablet Take 1 tablet by mouth every 12 (twelve) hours. 10/20/13  Yes Kaitlyn Szekalski, PA-C   BP 123/83  Pulse 76  Temp(Src) 98.1 F (36.7 C) (Oral)  Resp 10  Ht 6' (1.829 m)  Wt 189 lb (85.73 kg)  BMI 25.63 kg/m2  SpO2 96% Physical Exam  Nursing note and vitals reviewed. Constitutional: He appears well-developed and well-nourished. No distress.  HENT:  Head: Normocephalic and atraumatic.  Nose: Mucosal edema and rhinorrhea present. No sinus tenderness, nasal deformity or nasal septal hematoma. Right sinus exhibits no maxillary sinus tenderness and no frontal sinus tenderness. Left sinus exhibits no maxillary sinus tenderness and no frontal sinus tenderness.  Eyes: Pupils are equal, round, and reactive to light.  Neck: Normal range of motion. Neck supple.  Cardiovascular: Normal rate and regular rhythm.   Pulmonary/Chest: Effort normal.  Abdominal: Soft.  Neurological: He is alert.  Skin: Skin is warm and dry.    ED Course  Procedures (including critical care time) Labs Review Labs Reviewed - No data to display  Imaging Review Dg Chest 2 View  10/28/2013   CLINICAL DATA:  Short of breath  EXAM: CHEST  2 VIEW  COMPARISON:  09/15/2011  FINDINGS: The heart size and mediastinal contours are within normal limits. Both lungs are clear. The visualized skeletal structures are unremarkable.  IMPRESSION: No active cardiopulmonary disease.   Electronically Signed   By: Marlan Palau M.D.   On: 10/28/2013 18:49     EKG Interpretation None      MDM   Final diagnoses:  Chronic sinusitis    This is a chronic problem and he has failed all treatments over the past year and 7-8 years. I feel he needs to be evaluated by  ENT for possible surgery to open up his sinus passages because they may be blocked preventing his sinuses from draining. Discussed case with Dr. Rubin Payor prior to discharge.  68 y.o.Travis Dalton's evaluation in the Emergency Department is complete. It has been determined that no acute conditions requiring further emergency intervention are present at this time. The patient/guardian have been advised of the diagnosis and plan. We have discussed signs and symptoms that warrant return to the ED, such as changes or worsening in symptoms.  Vital signs are stable at discharge. Filed Vitals:   10/28/13 2145  BP: 123/83  Pulse: 76  Temp:   Resp: 10    Patient/guardian has voiced understanding and agreed to follow-up with the PCP or specialist.     Dorthula Matas, PA-C 10/28/13 2215

## 2013-10-28 NOTE — Discharge Instructions (Signed)

## 2013-10-30 NOTE — ED Provider Notes (Signed)
Medical screening examination/treatment/procedure(s) were performed by non-physician practitioner and as supervising physician I was immediately available for consultation/collaboration.   EKG Interpretation None       Juliet Rude. Rubin Payor, MD 10/30/13 0000

## 2013-11-15 ENCOUNTER — Ambulatory Visit (INDEPENDENT_AMBULATORY_CARE_PROVIDER_SITE_OTHER): Payer: Medicare Other | Admitting: Internal Medicine

## 2013-11-15 ENCOUNTER — Other Ambulatory Visit (INDEPENDENT_AMBULATORY_CARE_PROVIDER_SITE_OTHER): Payer: Medicare Other

## 2013-11-15 ENCOUNTER — Encounter: Payer: Self-pay | Admitting: Internal Medicine

## 2013-11-15 VITALS — BP 122/64 | HR 78 | Ht 72.0 in | Wt 188.0 lb

## 2013-11-15 DIAGNOSIS — J339 Nasal polyp, unspecified: Secondary | ICD-10-CM

## 2013-11-15 DIAGNOSIS — J45909 Unspecified asthma, uncomplicated: Secondary | ICD-10-CM

## 2013-11-15 DIAGNOSIS — J310 Chronic rhinitis: Secondary | ICD-10-CM

## 2013-11-15 LAB — CBC WITH DIFFERENTIAL/PLATELET
Basophils Absolute: 0 10*3/uL (ref 0.0–0.1)
Basophils Relative: 0.1 % (ref 0.0–3.0)
EOS PCT: 0.6 % (ref 0.0–5.0)
Eosinophils Absolute: 0 10*3/uL (ref 0.0–0.7)
HCT: 40.5 % (ref 39.0–52.0)
HEMOGLOBIN: 12.9 g/dL — AB (ref 13.0–17.0)
LYMPHS PCT: 15.9 % (ref 12.0–46.0)
Lymphs Abs: 0.9 10*3/uL (ref 0.7–4.0)
MCHC: 31.9 g/dL (ref 30.0–36.0)
MCV: 83.5 fl (ref 78.0–100.0)
MONOS PCT: 2 % — AB (ref 3.0–12.0)
Monocytes Absolute: 0.1 10*3/uL (ref 0.1–1.0)
NEUTROS ABS: 4.8 10*3/uL (ref 1.4–7.7)
Neutrophils Relative %: 81.4 % — ABNORMAL HIGH (ref 43.0–77.0)
Platelets: 244 10*3/uL (ref 150.0–400.0)
RBC: 4.85 Mil/uL (ref 4.22–5.81)
RDW: 14.8 % (ref 11.5–15.5)
WBC: 5.9 10*3/uL (ref 4.0–10.5)

## 2013-11-15 MED ORDER — OLMESARTAN MEDOXOMIL 20 MG PO TABS: 20.0000 mg | ORAL_TABLET | Freq: Every day | ORAL | Status: DC

## 2013-11-15 NOTE — Progress Notes (Signed)
11/15/13- 68 yoM never smoker FOLLOWS FOR: Referred by Dr. Annalee GentaShoemaker for nasal polyps and asthma. Pt c/o nasal blockage and unable to breath through nose and nasal drainage. C/o mild SOB with activity. Denies cough and CP.  Hx repeated ER visits for sinusitis, asthma. He reports onset to asthma and sinus blockage about 2 years ago. The asthma component is now better. Symptoms of nasal congestion and some drainage are perennial. He denies past history of seasonal allergic rhinitis or at allergic family. He is a [place] veteran and the [facility] did allergy skin testing 10- 12 months ago, which he says were not significantly positive. No history of food or skin allergies. Perhaps coincidentally, symptoms began around the same time he started lisinopril for blood pressure. He has not had polypectomy.VA starting Xolair injections, put on hold after the first dose when he had a sinus flare. He denies aspirin/ NSAID sensitivity but rarely uses these medicines because he has no arthritis or headache pains. He is now on a prednisone taper and expect symptoms to flare again as he comes off. He has not had adequate control with nasal steroids but may have been inconsistent. Now on Ceftin. CT sinus 10/19/13 IMPRESSION:  1. There is pansinus inflammation. The nasal passages are obstructed  by soft tissue density material. There is no evidence of expansile  remodeling of the bony confines of the paranasal sinuses.  2. There is no abnormality of the orbital structures.  3. The facial bones exhibit no acute abnormalities.  Electronically Signed  By: David SwazilandJordan  On: 10/19/2013 23:20 CXR 10/28/13 IMPRESSION:  No active cardiopulmonary disease.  Electronically Signed  By: Marlan Palauharles Clark M.D.  On: 10/28/2013 18:49  Prior to Admission medications   Medication Sig Start Date End Date Taking? Authorizing Provider  atorvastatin (LIPITOR) 40 MG tablet Take 20 mg by mouth daily.    Yes Historical Provider, MD   budesonide-formoterol (SYMBICORT) 160-4.5 MCG/ACT inhaler Inhale 1 puff into the lungs 2 (two) times daily.    Yes Historical Provider, MD  cefUROXime (CEFTIN) 250 MG tablet Take 250 mg by mouth 2 (two) times daily with a meal.   Yes Historical Provider, MD  cetirizine (ZYRTEC) 10 MG tablet Take 10 mg by mouth at bedtime.    Yes Historical Provider, MD  flunisolide (NASALIDE) 0.025 % SOLN Inhale 2 sprays into the lungs 2 (two) times daily as needed. For congestion   Yes Historical Provider, MD  fluticasone (FLONASE) 50 MCG/ACT nasal spray Place 2 sprays into the nose daily. 03/16/12  Yes Marinda ElkAbraham Feliz Ortiz, MD  hydrochlorothiazide (HYDRODIURIL) 25 MG tablet Take 12.5 mg by mouth daily.   Yes Historical Provider, MD  metFORMIN (GLUCOPHAGE) 500 MG tablet Take 500 mg by mouth daily with breakfast.    Yes Historical Provider, MD  montelukast (SINGULAIR) 10 MG tablet Take 10 mg by mouth at bedtime.   Yes Historical Provider, MD  predniSONE (DELTASONE) 10 MG tablet Take 10 mg by mouth daily with breakfast. Pt on taper. 4 tab x 3days, 3tab x 3 days, 2 tab x 3 days, 1 tab  X 3 days   Yes Historical Provider, MD  fish oil-omega-3 fatty acids 1000 MG capsule Take 1 g by mouth daily.    Historical Provider, MD  Ibuprofen-Diphenhydramine HCl (ADVIL PM) 200-25 MG CAPS Take 2 tablets by mouth at bedtime as needed. To help rest    Historical Provider, MD  lisinopril (PRINIVIL,ZESTRIL) 10 MG tablet Take 5 mg by mouth daily. Patient ran  out of lisinopril on 03-12-2012  Has started taking some old HCTZ  He had at home until he could see doctor    Historical Provider, MD  Multiple Vitamins-Minerals (MULTIVITAMIN WITH MINERALS) tablet Take 1 tablet by mouth daily.    Historical Provider, MD  olmesartan (BENICAR) 20 MG tablet Take 1 tablet (20 mg total) by mouth daily. 11/15/13   Waymon Budgelinton D Colie Josten, MD   Past Medical History  Diagnosis Date  . Hypertension   . Multiple allergies   . Asthma   . Sinusitis   . High  cholesterol   . COPD (chronic obstructive pulmonary disease)   . Chronic bronchitis   . Shortness of breath     "occasionally; at any time; related to my asthma" (03/15/2012)  . Type II diabetes mellitus   . Inguinal hernia     unrepaired; left groin (03/15/2012)  . PTSD (post-traumatic stress disorder)     "40%" (03/15/2012)  . Periorbital cellulitis 03/15/2012   Past Surgical History  Procedure Laterality Date  . Shoulder arthroscopy w/ rotator cuff repair  ~ 2000    right   Family History  Problem Relation Age of Onset  . Congestive Heart Failure Father   . Kidney failure Mother    History   Social History  . Marital Status: Married    Spouse Name: N/A    Number of Children: N/A  . Years of Education: N/A   Occupational History  . retired    Social History Main Topics  . Smoking status: Never Smoker   . Smokeless tobacco: Never Used  . Alcohol Use: No  . Drug Use: No  . Sexual Activity: Yes   Other Topics Concern  . Not on file   Social History Narrative  . No narrative on file   ROS-see HPI Constitutional:   No-   weight loss, night sweats, fevers, chills, fatigue, lassitude. HEENT:   No-  headaches, difficulty swallowing, tooth/dental problems, sore throat,       No-  sneezing, itching, ear ache, +nasal congestion, +post nasal drip,  CV:  No-   chest pain, orthopnea, PND, swelling in lower extremities, anasarca,                                  dizziness, palpitations Resp: No-   shortness of breath with exertion or at rest.              No-   productive cough,  No non-productive cough,  No- coughing up of blood.              No-   change in color of mucus.  No- wheezing.   Skin: No-   rash or lesions. GI:  No-   heartburn, indigestion, abdominal pain, nausea, vomiting, diarrhea,                 change in bowel habits, loss of appetite GU: No-   dysuria, change in color of urine, no urgency or frequency.  No- flank pain. MS:  No-   joint pain or  swelling.  No- decreased range of motion.  No- back pain. Neuro-     nothing unusual Psych:  No- change in mood or affect. No depression or anxiety.  No memory loss.  OBJ- Physical Exam General- Alert, Oriented, Affect-appropriate, Distress- none acute Skin- rash-none, lesions- none, excoriation- none Lymphadenopathy- none Head- atraumatic  Eyes- Gross vision intact, PERRLA, conjunctivae and secretions clear            Ears- Hearing, canals-normal            Nose-  no-Septal dev, mucus,  erosion, perforation, + bilateral nasal polyps            Throat- Mallampati II , mucosa clear , drainage- none, tonsils- atrophic Neck- flexible , trachea midline, no stridor , thyroid nl, carotid no bruit Chest - symmetrical excursion , unlabored           Heart/CV- RRR , no murmur , no gallop  , no rub, nl s1 s2                           - JVD- none , edema- none, stasis changes- none, varices- none           Lung- clear to P&A, wheeze- none, cough- none , dullness-none, rub- none           Chest wall-  Abd- tender-no, distended-no, bowel sounds-present, HSM- no Br/ Gen/ Rectal- Not done, not indicated Extrem- cyanosis- none, clubbing, none, atrophy- none, strength- nl Neuro- grossly intact to observation

## 2013-11-15 NOTE — Assessment & Plan Note (Signed)
Mild intermittent asthma, well-controlled 

## 2013-11-15 NOTE — Patient Instructions (Signed)
Script to substitute Benicar for 1 month instead of lisinopril. After that, go back to lisinopril and see if you can tell any difference for your nose.  Sample Dymista nasal spray  1-2 puffs each nostril once daily at bedtime. Try this instead of nasalide or flonase  Order lab- CBC w diff, Allergy profile     Dx non-allergic rhinitis, nasal polyps

## 2013-11-15 NOTE — Assessment & Plan Note (Signed)
New onset asthma and nasal polyps 2 years ago in an adult without atopic history. No apparent relation to NSAIDS. Skin testing was reportedly negative for common allergens but we will repeat query with [facility]. He reports control with oral prednisone but apparently not adequately with nasal steroids. A limited Google Search found only some anecdotal association of nasal polyps with lisinopril, but it is interesting that he started lisinopril around the time these symptoms began. Plan-allergy profile, sample Dymista, for one month replace lisinopril ACEI with Benicar ARB. followup with Dr. Annalee GentaShoemaker

## 2013-11-18 LAB — ALLERGY FULL PROFILE
ALTERNARIA ALTERNATA: 0.12 kU/L — AB
Allergen, D pternoyssinus,d7: 0.96 kU/L — ABNORMAL HIGH
Allergen,Goose feathers, e70: 0.17 kU/L — ABNORMAL HIGH
Aspergillus fumigatus, m3: 0.36 kU/L — ABNORMAL HIGH
Bahia Grass: <0.1 kU/L
Bermuda Grass: <0.1 kU/L
Box Elder IgE: <0.1 kU/L
Candida Albicans: 1.28 kU/L — ABNORMAL HIGH
Common Ragweed: <0.1 kU/L
D. farinae: 1.33 kU/L — ABNORMAL HIGH
DOG DANDER: 0.14 kU/L — AB
Elm IgE: 0.11 kU/L — ABNORMAL HIGH
Fescue: 0.13 kU/L — ABNORMAL HIGH
G009 Red Top: 0.11 kU/L — ABNORMAL HIGH
Goldenrod: <0.1 kU/L
HELMINTHOSPORIUM HALODES: 0.18 kU/L — AB
House Dust Hollister: 0.25 kU/L — ABNORMAL HIGH
IGE (IMMUNOGLOBULIN E), SERUM: 740.5 [IU]/mL — AB (ref 0.0–180.0)
LAMB'S QUARTERS CLASS: 0.18 kU/L — AB
Oak: <0.1 kU/L
Plantain: 0.13 kU/L — ABNORMAL HIGH
STEMPHYLIUM BOTRYOSUM: 0.12 kU/L — AB
SYCAMORE TREE: 0.12 kU/L — AB
Timothy Grass: <0.1 kU/L

## 2013-11-18 NOTE — Progress Notes (Signed)
Quick Note:  Spoke with pt to discuss results. He is aware of results and recs. Verified rov. Nothing further needed at this time. ______

## 2013-12-30 ENCOUNTER — Ambulatory Visit: Payer: Medicare Other | Admitting: Internal Medicine

## 2014-01-25 ENCOUNTER — Emergency Department (HOSPITAL_COMMUNITY)
Admission: EM | Admit: 2014-01-25 | Discharge: 2014-01-25 | Disposition: A | Discharge status: Home / Self Care | Payer: Medicare Other | Attending: Emergency Medicine | Admitting: Emergency Medicine

## 2014-01-25 ENCOUNTER — Encounter (HOSPITAL_COMMUNITY): Payer: Self-pay | Admitting: Emergency Medicine

## 2014-01-25 DIAGNOSIS — J449 Chronic obstructive pulmonary disease, unspecified: Secondary | ICD-10-CM | POA: Insufficient documentation

## 2014-01-25 DIAGNOSIS — IMO0002 Reserved for concepts with insufficient information to code with codable children: Secondary | ICD-10-CM | POA: Insufficient documentation

## 2014-01-25 DIAGNOSIS — Z79899 Other long term (current) drug therapy: Secondary | ICD-10-CM | POA: Insufficient documentation

## 2014-01-25 DIAGNOSIS — I10 Essential (primary) hypertension: Secondary | ICD-10-CM | POA: Insufficient documentation

## 2014-01-25 DIAGNOSIS — R55 Syncope and collapse: Secondary | ICD-10-CM | POA: Insufficient documentation

## 2014-01-25 DIAGNOSIS — Z8659 Personal history of other mental and behavioral disorders: Secondary | ICD-10-CM | POA: Diagnosis not present

## 2014-01-25 DIAGNOSIS — R404 Transient alteration of awareness: Secondary | ICD-10-CM | POA: Diagnosis present

## 2014-01-25 DIAGNOSIS — Z872 Personal history of diseases of the skin and subcutaneous tissue: Secondary | ICD-10-CM | POA: Diagnosis not present

## 2014-01-25 DIAGNOSIS — E119 Type 2 diabetes mellitus without complications: Secondary | ICD-10-CM | POA: Insufficient documentation

## 2014-01-25 DIAGNOSIS — J4489 Other specified chronic obstructive pulmonary disease: Secondary | ICD-10-CM | POA: Insufficient documentation

## 2014-01-25 DIAGNOSIS — E7800 Pure hypercholesterolemia, unspecified: Secondary | ICD-10-CM | POA: Insufficient documentation

## 2014-01-25 DIAGNOSIS — Z792 Long term (current) use of antibiotics: Secondary | ICD-10-CM | POA: Insufficient documentation

## 2014-01-25 LAB — COMPREHENSIVE METABOLIC PANEL
ALBUMIN: 3.2 g/dL — AB (ref 3.5–5.2)
ALT: 17 U/L (ref 0–53)
ANION GAP: 13 (ref 5–15)
AST: 12 U/L (ref 0–37)
Alkaline Phosphatase: 43 U/L (ref 39–117)
BILIRUBIN TOTAL: 0.8 mg/dL (ref 0.3–1.2)
BUN: 14 mg/dL (ref 6–23)
CO2: 26 mEq/L (ref 19–32)
Calcium: 9.8 mg/dL (ref 8.4–10.5)
Chloride: 101 mEq/L (ref 96–112)
Creatinine, Ser: 1.34 mg/dL (ref 0.50–1.35)
GFR calc Af Amer: 60 mL/min — ABNORMAL LOW (ref >90)
GFR calc non Af Amer: 52 mL/min — ABNORMAL LOW (ref >90)
Glucose, Bld: 123 mg/dL — ABNORMAL HIGH (ref 70–99)
Potassium: 3.9 mEq/L (ref 3.7–5.3)
SODIUM: 140 meq/L (ref 137–147)
TOTAL PROTEIN: 6.6 g/dL (ref 6.0–8.3)

## 2014-01-25 LAB — I-STAT TROPONIN, ED: Troponin i, poc: 0 ng/mL (ref 0.00–0.08)

## 2014-01-25 LAB — CBC WITH DIFFERENTIAL/PLATELET
BASOS PCT: 0 % (ref 0–1)
Basophils Absolute: 0 10*3/uL (ref 0.0–0.1)
EOS ABS: 0.1 10*3/uL (ref 0.0–0.7)
Eosinophils Relative: 1 % (ref 0–5)
HCT: 36.8 % — ABNORMAL LOW (ref 39.0–52.0)
HEMOGLOBIN: 11.8 g/dL — AB (ref 13.0–17.0)
Lymphocytes Relative: 31 % (ref 12–46)
Lymphs Abs: 3 10*3/uL (ref 0.7–4.0)
MCH: 25.9 pg — ABNORMAL LOW (ref 26.0–34.0)
MCHC: 32.1 g/dL (ref 30.0–36.0)
MCV: 80.9 fL (ref 78.0–100.0)
Monocytes Absolute: 0.7 10*3/uL (ref 0.1–1.0)
Monocytes Relative: 8 % (ref 3–12)
NEUTROS ABS: 5.8 10*3/uL (ref 1.7–7.7)
Neutrophils Relative %: 60 % (ref 43–77)
PLATELETS: 215 10*3/uL (ref 150–400)
RBC: 4.55 MIL/uL (ref 4.22–5.81)
RDW: 13.6 % (ref 11.5–15.5)
WBC: 9.7 10*3/uL (ref 4.0–10.5)

## 2014-01-25 MED ORDER — SODIUM CHLORIDE 0.9 % IV BOLUS (SEPSIS)
1000.0000 mL | Freq: Once | INTRAVENOUS | Status: AC
  Administered 2014-01-25: 1000 mL via INTRAVENOUS

## 2014-01-25 NOTE — Discharge Instructions (Signed)
Syncope °Syncope means a person passes out (faints). The person usually wakes up in less than 5 minutes. It is important to seek medical care for syncope. °HOME CARE °· Have someone stay with you until you feel normal. °· Do not drive, use machines, or play sports until your doctor says it is okay. °· Keep all doctor visits as told. °· Lie down when you feel like you might pass out. Take deep breaths. Wait until you feel normal before standing up. °· Drink enough fluids to keep your pee (urine) clear or pale yellow. °· If you take blood pressure or heart medicine, get up slowly. Take several minutes to sit and then stand. °GET HELP RIGHT AWAY IF:  °· You have a severe headache. °· You have pain in the chest, belly (abdomen), or back. °· You are bleeding from the mouth or butt (rectum). °· You have black or tarry poop (stool). °· You have an irregular or very fast heartbeat. °· You have pain with breathing. °· You keep passing out, or you have shaking (seizures) when you pass out. °· You pass out when sitting or lying down. °· You feel confused. °· You have trouble walking. °· You have severe weakness. °· You have vision problems. °If you fainted, call your local emergency services (911 in [place] Do not drive yourself to the hospital. °MAKE SURE YOU:  °· Understand these instructions. °· Will watch your condition. °· Will get help right away if you are not doing well or get worse. °Document Released: 11/09/2007 Document Revised: 11/22/2011 Document Reviewed: 07/22/2011 °ExitCare® Patient Information ©2015 [employer]. This information is not intended to replace advice given to you by your health care provider. Make sure you discuss any questions you have with your health care provider. ° °

## 2014-01-25 NOTE — ED Notes (Signed)
Per [facility], pt was outside at [employer] when he had a witnessed syncopal episode by bystanders, pt fell backwards from a standing position to the ground which was a grassy area, pt did loose consciousness for a brief period of time, pt does not remember loosing consciousness but does remember waking up to people standing around him. Pt denies Chest pain, SOB, nausea, vomiting, or fever. Pt reports he had nasal surgery on Monday and has been resting all week, states he did not sleep good last night. VSS, 20 g Left hand, ASA 324 mg given by EMS

## 2014-01-25 NOTE — ED Notes (Signed)
Pt ambulated with no complaints pt says " I feel fine."

## 2014-01-25 NOTE — ED Provider Notes (Signed)
CSN: [id number]     Arrival date & time 01/25/14  1641 History   First MD Initiated Contact with Patient 01/25/14 1645     Chief Complaint  Patient presents with  . Loss of Consciousness     (Consider location/radiation/quality/duration/timing/severity/associated sxs/prior Treatment) Patient is a 68 y.o. male presenting with syncope. The history is provided by the patient.  Loss of Consciousness Episode history:  Single Most recent episode:  Today Duration:  2 seconds Timing:  Constant Progression:  Unchanged Chronicity:  New Context comment:  Standing Witnessed: yes   Relieved by:  Lying down and drinking Worsened by:  Nothing tried Ineffective treatments:  None tried Associated symptoms: no anxiety, no chest pain, no confusion, no diaphoresis, no fever, no headaches, no palpitations, no recent fall, no shortness of breath and no vomiting   Risk factors: no congenital heart disease, no coronary artery disease and no seizures    68 yo M with a chief complaint of syncope. Patient was at a media day for [employer]. Outside all day, drinking and eating normally.  Recent surgery.  Denies hx of cardiac disease, denies hx of blood clots.   Denies cp, sob.  No recent fever, chills.  Denies headache, neck pain.  No symptoms currently.     Past Medical History  Diagnosis Date  . Hypertension   . Multiple allergies   . Asthma   . Sinusitis   . High cholesterol   . COPD (chronic obstructive pulmonary disease)   . Chronic bronchitis   . Shortness of breath     "occasionally; at any time; related to my asthma" (03/15/2012)  . Type II diabetes mellitus   . Inguinal hernia     unrepaired; left groin (03/15/2012)  . PTSD (post-traumatic stress disorder)     "40%" (03/15/2012)  . Periorbital cellulitis 03/15/2012   Past Surgical History  Procedure Laterality Date  . Shoulder arthroscopy w/ rotator cuff repair  ~ 2000    right   Family History  Problem Relation Age of Onset  .  Congestive Heart Failure Father   . Kidney failure Mother    History  Substance Use Topics  . Smoking status: Never Smoker   . Smokeless tobacco: Never Used  . Alcohol Use: No    Review of Systems  Constitutional: Negative for fever, chills and diaphoresis.  HENT: Negative for congestion and facial swelling.   Eyes: Negative for discharge and visual disturbance.  Respiratory: Negative for shortness of breath.   Cardiovascular: Positive for syncope. Negative for chest pain and palpitations.  Gastrointestinal: Negative for vomiting, abdominal pain and diarrhea.  Musculoskeletal: Negative for arthralgias and myalgias.  Skin: Negative for color change and rash.  Neurological: Positive for syncope. Negative for tremors and headaches.  Psychiatric/Behavioral: Negative for confusion and dysphoric mood.      Allergies  Review of patient's allergies indicates no known allergies.  Home Medications   Prior to Admission medications   Medication Sig Start Date End Date Taking? Authorizing Provider  amoxicillin-clavulanate (AUGMENTIN) 875-125 MG per tablet Take 1 tablet by mouth 2 (two) times daily.   Yes Historical Provider, MD  cetirizine (ZYRTEC) 10 MG tablet Take 10 mg by mouth at bedtime as needed for allergies.    Yes Historical Provider, MD  hydrochlorothiazide (HYDRODIURIL) 25 MG tablet Take 12.5 mg by mouth daily.   Yes Historical Provider, MD  lisinopril (PRINIVIL,ZESTRIL) 10 MG tablet Take 5 mg by mouth daily. Patient ran out of lisinopril on 03-12-2012  Has started taking some old HCTZ  He had at home until he could see doctor   Yes Historical Provider, MD  metFORMIN (GLUCOPHAGE) 500 MG tablet Take 500 mg by mouth 2 (two) times daily with a meal.    Yes Historical Provider, MD  montelukast (SINGULAIR) 10 MG tablet Take 10 mg by mouth at bedtime as needed (for allergies).    Yes Historical Provider, MD  predniSONE (DELTASONE) 10 MG tablet Take 10 mg by mouth daily with breakfast. Pt  on taper. 4 tab x 3days, 3tab x 3 days, 2 tab x 3 days, 1 tab  X 3 days   Yes Historical Provider, MD  Sodium Chloride-Sodium Bicarb (NETI POT SINUS WASH NA) Place 1 application into the nose 2 (two) times daily.   Yes Historical Provider, MD  atorvastatin (LIPITOR) 40 MG tablet Take 20 mg by mouth daily.     Historical Provider, MD  budesonide-formoterol (SYMBICORT) 160-4.5 MCG/ACT inhaler Inhale 1 puff into the lungs daily as needed (for shortness of breath).     Historical Provider, MD  cefUROXime (CEFTIN) 250 MG tablet Take 250 mg by mouth 2 (two) times daily with a meal.    Historical Provider, MD  fish oil-omega-3 fatty acids 1000 MG capsule Take 1 g by mouth daily.    Historical Provider, MD  flunisolide (NASALIDE) 0.025 % SOLN Inhale 2 sprays into the lungs 2 (two) times daily as needed. For congestion    Historical Provider, MD  fluticasone (FLONASE) 50 MCG/ACT nasal spray Place 2 sprays into the nose daily. 03/16/12   Marinda ElkAbraham Feliz Ortiz, MD  Ibuprofen-Diphenhydramine HCl (ADVIL PM) 200-25 MG CAPS Take 2 tablets by mouth at bedtime as needed. To help rest    Historical Provider, MD  Multiple Vitamins-Minerals (MULTIVITAMIN WITH MINERALS) tablet Take 1 tablet by mouth daily.    Historical Provider, MD  olmesartan (BENICAR) 20 MG tablet Take 1 tablet (20 mg total) by mouth daily. 11/15/13   Waymon Budgelinton D Young, MD   BP 135/81  Pulse 76  Temp(Src) 98.1 F (36.7 C) (Oral)  Resp 17  SpO2 99% Physical Exam  Constitutional: He is oriented to person, place, and time. He appears well-developed and well-nourished.  HENT:  Head: Normocephalic and atraumatic.  Eyes: EOM are normal. Pupils are equal, round, and reactive to light.  Neck: Normal range of motion. Neck supple. No JVD present.  Cardiovascular: Normal rate and regular rhythm.  Exam reveals no gallop and no friction rub.   No murmur heard. Pulmonary/Chest: No respiratory distress. He has no wheezes.  Abdominal: He exhibits no distension.  There is no rebound and no guarding.  Musculoskeletal: Normal range of motion.  Neurological: He is alert and oriented to person, place, and time.  Skin: No rash noted. No pallor.  Psychiatric: He has a normal mood and affect. His behavior is normal.    ED Course  Procedures (including critical care time) Labs Review Labs Reviewed  CBC WITH DIFFERENTIAL - Abnormal; Notable for the following:    Hemoglobin 11.8 (*)    HCT 36.8 (*)    MCH 25.9 (*)    All other components within normal limits  COMPREHENSIVE METABOLIC PANEL - Abnormal; Notable for the following:    Glucose, Bld 123 (*)    Albumin 3.2 (*)    GFR calc non Af Amer 52 (*)    GFR calc Af Amer 60 (*)    All other components within normal limits  Rosezena SensorI-STAT TROPOININ, ED  Imaging Review No results found.   EKG Interpretation   Date/Time:  Saturday January 25 2014 16:48:32 EDT Ventricular Rate:  72 PR Interval:  147 QRS Duration: 90 QT Interval:  357 QTC Calculation: 391 R Axis:   65 Text Interpretation:  Sinus rhythm Consider left ventricular hypertrophy  ST elevation, diffusely  No significant change since last tracing  Confirmed by YAO  MD, DAVID ([id number]) on 01/25/2014 4:53:33 PM      MDM   Final diagnoses:  None    68 yo M with a chief complaint of a syncopal event. She denies any preceding events. Likely dehydration or vasovagal. Obtain cbc, cmp, trop, orthostatic vitals.  Patient with mild orthostasis. Troponin negative lab work otherwise unremarkable. EKG without prolonged QT Brugada or Wolff-Parkinson-White.   We'll ambulate the patient she continues to be asymptomatic we'll discharge home to followup with PCP for possible Holter monitor.  5:53 PM:  I have discussed the diagnosis/risks/treatment options with the patient and family and believe the pt to be eligible for discharge home to follow-up with PCP. We also discussed returning to the ED immediately if new or worsening sx occur. We discussed the sx  which are most concerning (e.g., repeat syncope, CP, SOB) that necessitate immediate return. Medications administered to the patient during their visit and any new prescriptions provided to the patient are listed below.  Medications given during this visit Medications  sodium chloride 0.9 % bolus 1,000 mL (1,000 mLs Intravenous New Bag/Given 01/25/14 1711)    New Prescriptions   No medications on file     Melene Plan, MD 01/25/14 1753

## 2014-01-26 NOTE — ED Provider Notes (Signed)
I saw and evaluated the patient, reviewed the resident's note and I agree with the findings and plan.   EKG Interpretation   Date/Time:  Saturday January 25 2014 16:48:32 EDT Ventricular Rate:  72 PR Interval:  147 QRS Duration: 90 QT Interval:  357 QTC Calculation: 391 R Axis:   65 Text Interpretation:  Sinus rhythm Consider left ventricular hypertrophy  ST elevation, diffusely  No significant change since last tracing  Confirmed by Rosbel Buckner  MD, Daton Szilagyi ([id number]) on 01/25/2014 4:53:33 PM      Travis Dalton is a 68 y.o. male hx of HTN, HL, COPD here with syncope. He was outside all day and then had a syncopal event. No chest pain. He is well appearing on exam. Mildly orthostatic. Heart, lung abdomen unremarkable. No signs of head injury. Labs unremarkable. Patient hydrated and felt better. Stable for d/c. Recommend outpatient holter.    Richardean Canal, MD 01/26/14 [phone]

## 2015-05-24 IMAGING — CT CT MAXILLOFACIAL W/O CM
3 series · 15 of 47 positions shown, 18 images · non-contrast
Comparison: CT MAXILLOFACIAL W/O CM dated 03/15/2012

CLINICAL DATA: History of recurrent sinusitis

EXAM:
CT MAXILLOFACIAL WITHOUT CONTRAST
TECHNIQUE: Multidetector CT imaging of the maxillofacial structures was
performed. Multiplanar CT image reconstructions were also generated.
A small metallic BB was placed on the right temple in order to
reliably differentiate right from left.

[Series 201: facial bones · axial · 0.35mm/px · z∈[+45,+201]mm · 9 of 92 slices shown, 12 images]
[im 7/92  brain]
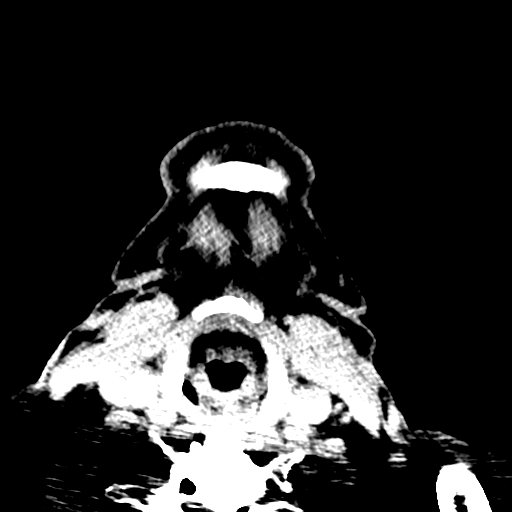
[im 7/92  bone]
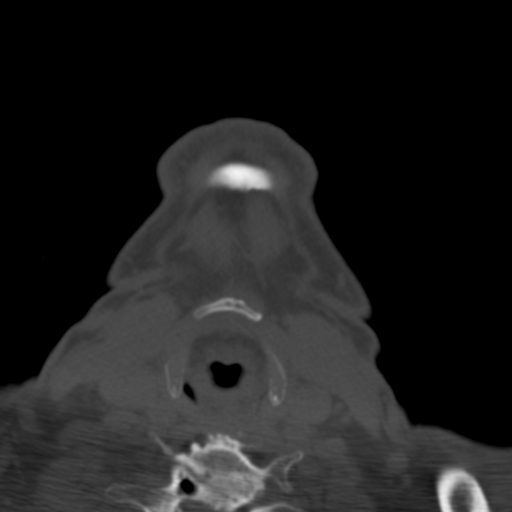
[im 16/92  bone]
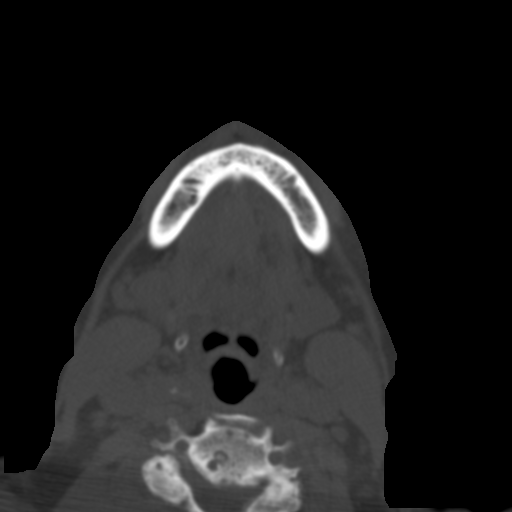
[im 26/92  bone]
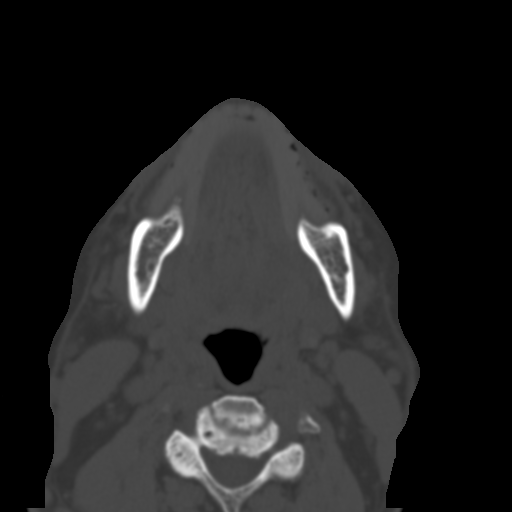
[im 35/92  bone]
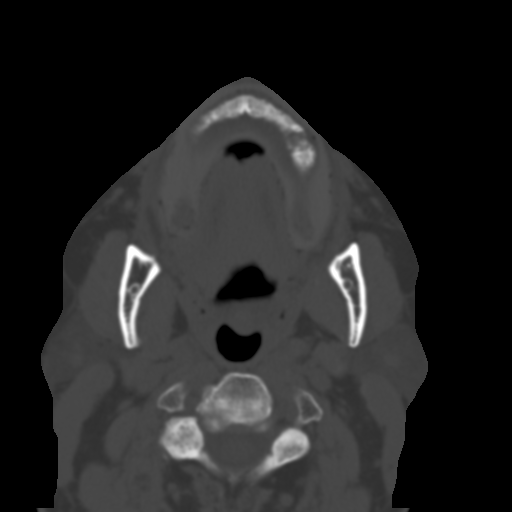
[im 48/92  brain]
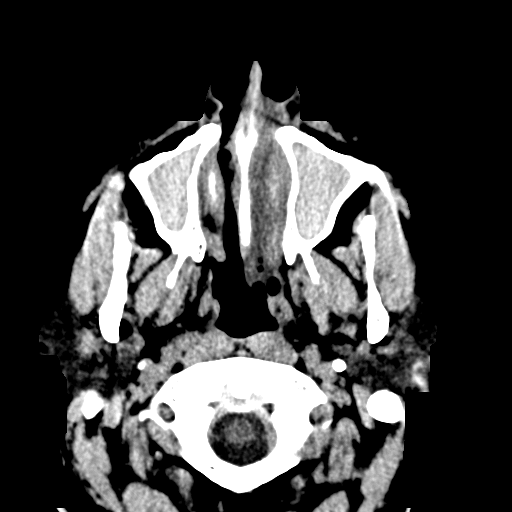
[im 48/92  bone]
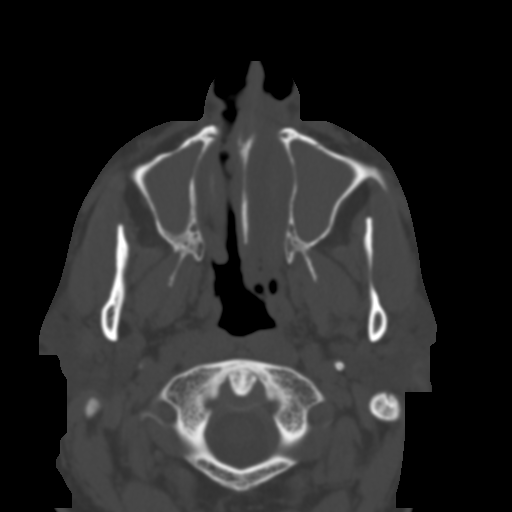
[im 57/92  bone]
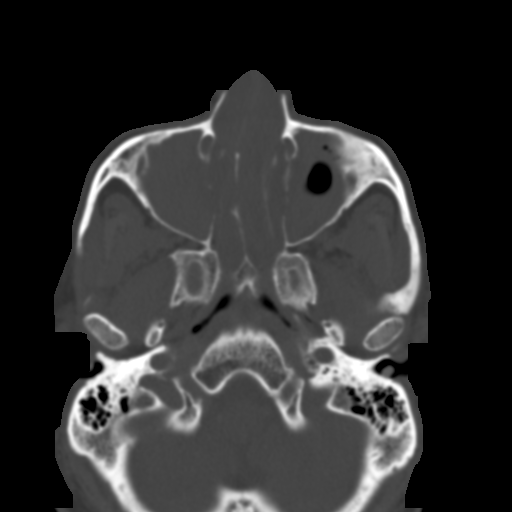
[im 66/92  bone]
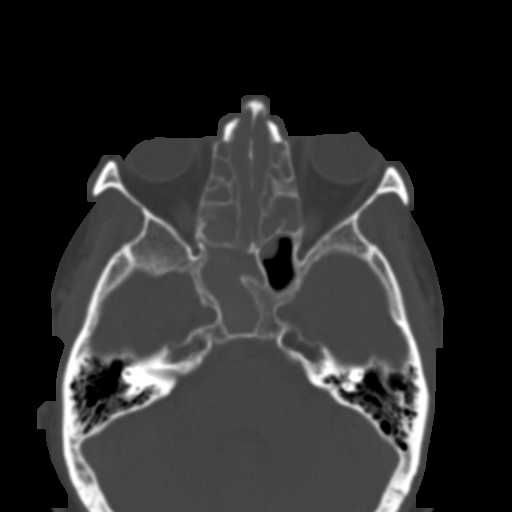
[im 76/92  bone]
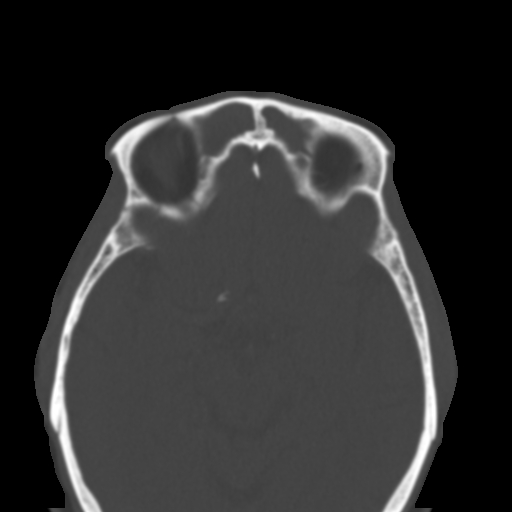
[im 85/92  brain]
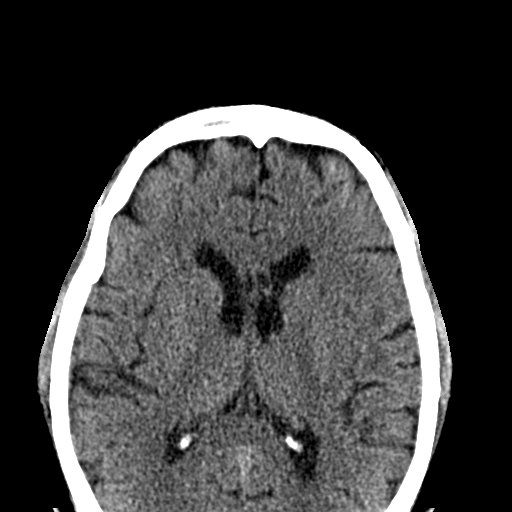
[im 85/92  bone]
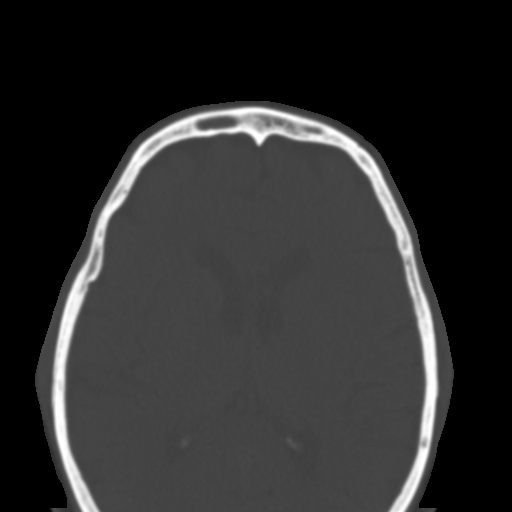

[Series 203: coronal std · coronal · 0.34mm/px · 3 of 85 slices shown]
[im 29/85  bone]
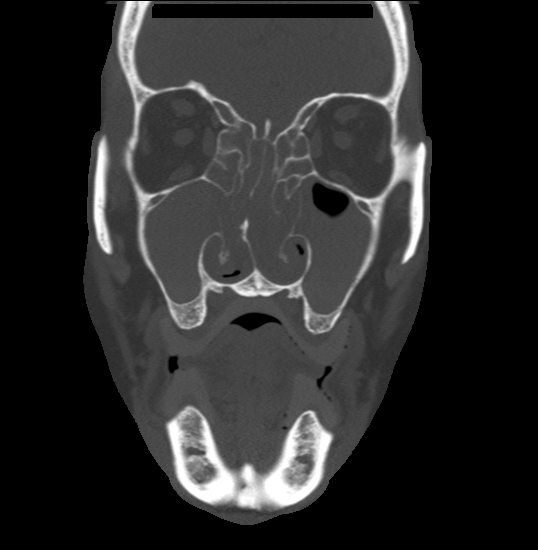
[im 38/85  bone]
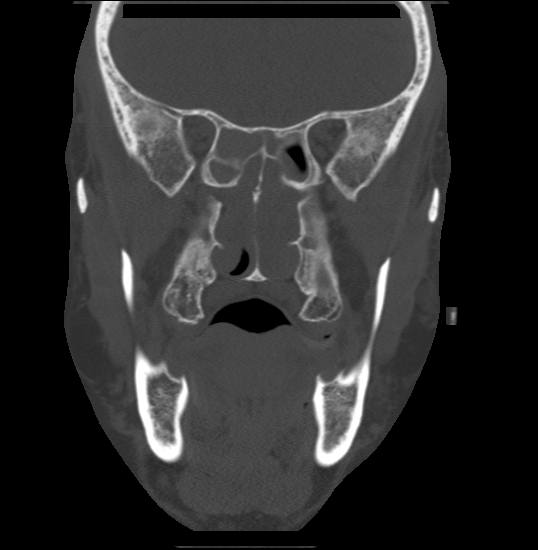
[im 47/85  bone]
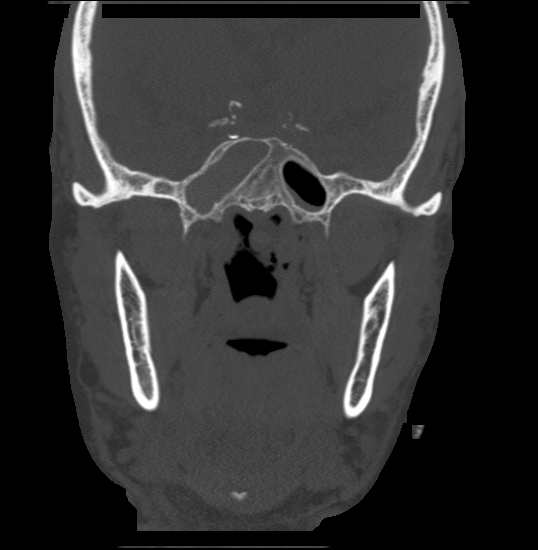

[Series 204: sagittal std · sagittal · 0.34mm/px · 3 of 90 slices shown]
[im 30/90  bone]
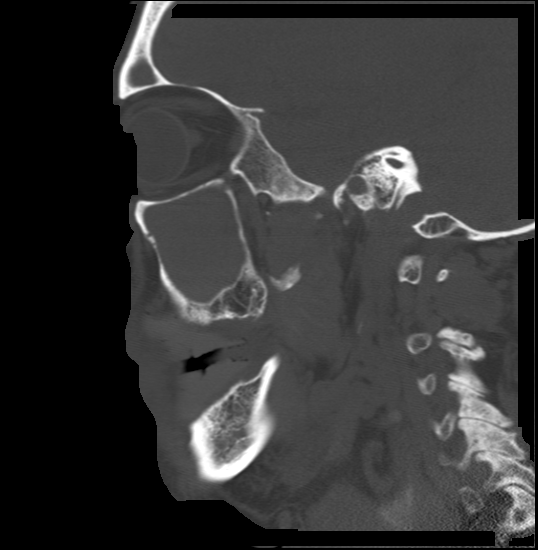
[im 45/90  bone]
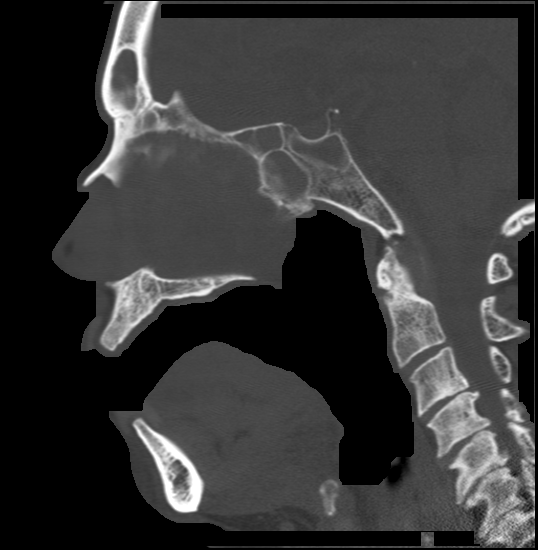
[im 60/90  bone]
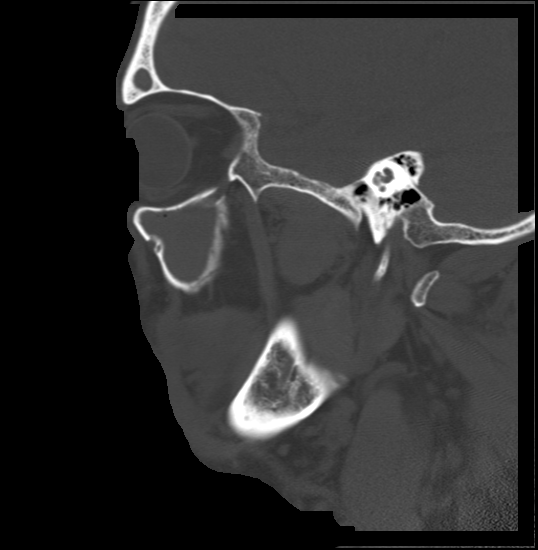

[15 of 47 positions shown; findings below may reference images not displayed]

FINDINGS: There is opacification of the frontal, ethmoid, right sphenoid, and
right maxillary sinuses. There is partial opacification of the left
sphenoid and left maxillary sinuses. There is soft tissue throughout
the nasal passages. The nasal septum is mildly deviated towards the
right. The mastoid air cells are well pneumatized.

The globes exhibit normal density and contour. There is no pre or
postseptal edema. The intraconal and extraconal soft tissues are
normal in appearance.

The bony orbits and the sinus walls appear intact. The zygomatic
arches and pterygoid plates appear normal. The temporomandibular
joints and the mandible appear normal.
IMPRESSION: 1. There is pansinus inflammation. The nasal passages are obstructed
by soft tissue density material. There is no evidence of expansile
remodeling of the bony confines of the paranasal sinuses.
2. There is no abnormality of the orbital structures.
3. The facial bones exhibit no acute abnormalities.

## 2016-11-03 DIAGNOSIS — I10 Essential (primary) hypertension: Secondary | ICD-10-CM | POA: Diagnosis not present

## 2016-11-03 DIAGNOSIS — J40 Bronchitis, not specified as acute or chronic: Secondary | ICD-10-CM | POA: Diagnosis not present

## 2016-11-03 DIAGNOSIS — B029 Zoster without complications: Secondary | ICD-10-CM | POA: Diagnosis not present

## 2020-09-14 DIAGNOSIS — R739 Hyperglycemia, unspecified: Secondary | ICD-10-CM | POA: Diagnosis not present

## 2020-09-14 DIAGNOSIS — T675XXA Heat exhaustion, unspecified, initial encounter: Secondary | ICD-10-CM | POA: Diagnosis not present

## 2020-09-14 DIAGNOSIS — R404 Transient alteration of awareness: Secondary | ICD-10-CM | POA: Diagnosis not present

## 2020-09-14 DIAGNOSIS — E1165 Type 2 diabetes mellitus with hyperglycemia: Secondary | ICD-10-CM | POA: Diagnosis not present

## 2020-09-14 DIAGNOSIS — Z743 Need for continuous supervision: Secondary | ICD-10-CM | POA: Diagnosis not present

## 2020-09-14 DIAGNOSIS — R42 Dizziness and giddiness: Secondary | ICD-10-CM | POA: Diagnosis not present

## 2020-11-24 ENCOUNTER — Other Ambulatory Visit: Payer: Self-pay

## 2020-11-24 ENCOUNTER — Ambulatory Visit
Admission: EM | Admit: 2020-11-24 | Discharge: 2020-11-24 | Disposition: A | Discharge status: Home / Self Care | Payer: Medicare Other

## 2020-11-24 DIAGNOSIS — Z20822 Contact with and (suspected) exposure to covid-19: Secondary | ICD-10-CM

## 2020-11-24 DIAGNOSIS — J069 Acute upper respiratory infection, unspecified: Secondary | ICD-10-CM | POA: Diagnosis not present

## 2020-11-24 MED ORDER — DM-GUAIFENESIN ER 30-600 MG PO TB12: 1.0000 | ORAL_TABLET | Freq: Two times a day (BID) | ORAL | 0 refills | Status: AC

## 2020-11-24 MED ORDER — BENZONATATE 200 MG PO CAPS: 200.0000 mg | ORAL_CAPSULE | Freq: Three times a day (TID) | ORAL | 0 refills | Status: AC | PRN

## 2020-11-24 MED ORDER — GUAIFENESIN-CODEINE 100-10 MG/5ML PO SOLN: 5.0000 mL | Freq: Every evening | ORAL | 0 refills | Status: AC | PRN

## 2020-11-24 MED ORDER — LORATADINE 10 MG PO TABS: 10.0000 mg | ORAL_TABLET | Freq: Every day | ORAL | 0 refills | Status: AC

## 2020-11-24 NOTE — ED Triage Notes (Signed)
Pt c/o cough since Saturday and since Sunday has a pain across upper abdomen around to back when coughing. States unable to rest.

## 2020-11-24 NOTE — Discharge Instructions (Addendum)
Daily loratadine/Claritin for congestion and drainage Mucinex DM twice daily for congestion and cough Use with Tessalon/benzonatate every 8 hours for cough during the day Robitussin with codeine for nighttime cough Drink plenty of fluids Tylenol and ibuprofen for lower rib discomfort with cough Follow-up if not improving or worsening

## 2020-11-24 NOTE — ED Provider Notes (Signed)
[facility]    CSN: [id number] Arrival date & time: 11/24/20  1431      History   Chief Complaint Chief Complaint  Patient presents with   Cough    HPI Travis Dalton is a 75 y.o. male history of asthma, COPD, DM type II, presenting today for evaluation of cough.  Reports that he has had cough for approximately 4 days.  Reports that he has developed discomfort in his lower ribs and upper abdomen with coughing.  Denies pain at rest or with deep inspiration.  Cough and pain keeping him up at night.  Denies fevers.  Denies COVID exposures.  Denies associated rhinorrhea or sore throat.  Denies wheezing.  HPI  Past Medical History:  Diagnosis Date   Asthma    Chronic bronchitis (HCC)    COPD (chronic obstructive pulmonary disease) (HCC)    High cholesterol    Hypertension    Inguinal hernia    unrepaired; left groin (03/15/2012)   Multiple allergies    Periorbital cellulitis 03/15/2012   PTSD (post-traumatic stress disorder)    "40%" (03/15/2012)   Shortness of breath    "occasionally; at any time; related to my asthma" (03/15/2012)   Sinusitis    Type II diabetes mellitus (HCC)     Patient Active Problem List   Diagnosis Date Noted   Multiple nasal polyps 11/15/2013   Periorbital cellulitis 03/15/2012   Acute recurrent sinusitis 03/15/2012   Hypertension 03/15/2012   Asthma, mild intermittent, well-controlled 03/15/2012   Diabetes mellitus (HCC) 03/15/2012    Past Surgical History:  Procedure Laterality Date   SHOULDER ARTHROSCOPY W/ ROTATOR CUFF REPAIR  ~ 2000   right       Home Medications    Prior to Admission medications   Medication Sig Start Date End Date Taking? Authorizing Provider  amLODipine (NORVASC) 5 MG tablet Take 5 mg by mouth daily.   Yes [provider]  benzonatate (TESSALON) 200 MG capsule Take 1 capsule (200 mg total) by mouth 3 (three) times daily as needed for up to 7 days for cough. 11/24/20 12/01/20 Yes  Levana Minetti C, PA-C  dextromethorphan-guaiFENesin (MUCINEX DM) 30-600 MG 12hr tablet Take 1 tablet by mouth 2 (two) times daily. 11/24/20  Yes Zarin Knupp C, PA-C  guaiFENesin-codeine 100-10 MG/5ML syrup Take 5-10 mLs by mouth at bedtime as needed for cough. 11/24/20  Yes Fantasha Daniele C, PA-C  loratadine (CLARITIN) 10 MG tablet Take 1 tablet (10 mg total) by mouth daily. 11/24/20  Yes Goldia Ligman C, PA-C  cetirizine (ZYRTEC) 10 MG tablet Take 10 mg by mouth at bedtime as needed for allergies.     [provider]  hydrochlorothiazide (HYDRODIURIL) 25 MG tablet Take 12.5 mg by mouth daily.    [provider]  metFORMIN (GLUCOPHAGE) 500 MG tablet Take 500 mg by mouth 2 (two) times daily with a meal.     [provider]  montelukast (SINGULAIR) 10 MG tablet Take 10 mg by mouth at bedtime as needed (for allergies).     [provider]  Sodium Chloride-Sodium Bicarb (NETI POT SINUS WASH NA) Place 1 application into the nose 2 (two) times daily.    [provider]    Family History Family History  Problem Relation Age of Onset   Congestive Heart Failure Father    Kidney failure Mother     Social History Social History   Tobacco Use   Smoking status: Never   Smokeless tobacco:  Never  Substance Use Topics   Alcohol use: No   Drug use: No     Allergies   Patient has no known allergies.   Review of Systems Review of Systems  Constitutional:  Negative for activity change, appetite change, chills, fatigue and fever.  HENT:  Positive for congestion. Negative for ear pain, rhinorrhea, sinus pressure, sore throat and trouble swallowing.   Eyes:  Negative for discharge and redness.  Respiratory:  Positive for cough. Negative for chest tightness and shortness of breath.   Cardiovascular:  Negative for chest pain.  Gastrointestinal:  Negative for abdominal pain, diarrhea, nausea and vomiting.  Musculoskeletal:  Negative for myalgias.   Skin:  Negative for rash.  Neurological:  Negative for dizziness, light-headedness and headaches.    Physical Exam Triage Vital Signs ED Triage Vitals [11/24/20 1624]  Enc Vitals Group     BP 132/75     Pulse Rate 73     Resp 18     Temp 98.8 F (37.1 C)     Temp Source Oral     SpO2 98 %     Weight      Height      Head Circumference      Peak Flow      Pain Score 0     Pain Loc      Pain Edu?      Excl. in GC?    No data found.  Updated Vital Signs BP 132/75 (BP Location: Left Arm)   Pulse 73   Temp 98.8 F (37.1 C) (Oral)   Resp 18   SpO2 98%   Visual Acuity Right Eye Distance:   Left Eye Distance:   Bilateral Distance:    Right Eye Near:   Left Eye Near:    Bilateral Near:     Physical Exam Vitals and nursing note reviewed.  Constitutional:      Appearance: He is well-developed.     Comments: No acute distress  HENT:     Head: Normocephalic and atraumatic.     Ears:     Comments: Bilateral ears without tenderness to palpation of external auricle, tragus and mastoid, EAC's without erythema or swelling, TM's with good bony landmarks and cone of light. Non erythematous.      Nose: Nose normal.     Mouth/Throat:     Comments: Oral mucosa pink and moist, no tonsillar enlargement or exudate. Posterior pharynx patent and nonerythematous, no uvula deviation or swelling. Normal phonation.  Eyes:     Conjunctiva/sclera: Conjunctivae normal.  Cardiovascular:     Rate and Rhythm: Normal rate.  Pulmonary:     Effort: Pulmonary effort is normal. No respiratory distress.     Comments: Breathing comfortably at rest, CTABL, no wheezing, rales or other adventitious sounds auscultated  Abdominal:     General: There is no distension.  Musculoskeletal:        General: Normal range of motion.     Cervical back: Neck supple.  Skin:    General: Skin is warm and dry.  Neurological:     Mental Status: He is alert and oriented to person, place, and time.      UC Treatments / Results  Labs (all labs ordered are listed, but only abnormal results are displayed) Labs Reviewed  NOVEL CORONAVIRUS, NAA    EKG   Radiology No results found.  Procedures Procedures (including critical care time)  Medications Ordered in UC Medications - No data to display  Initial Impression / Assessment and Plan / UC Course  I have reviewed the triage vital signs and the nursing notes.  Pertinent labs & imaging results that were available during my care of the patient were reviewed by me and considered in my medical decision making (see chart for details).     Viral URI/cough-COVID test pending for screening, treating with daily Claritin, Mucinex and Tessalon during the day, Robitussin with codeine for nighttime cough.  Lungs clear to auscultation, no hypoxia, suspect likely discomfort related to MSK strain from frequent coughing.  Tylenol and ibuprofen as needed for this and monitor for resolution with resolution of cough.  Discussed strict return precautions. Patient verbalized understanding and is agreeable with plan.  Final Clinical Impressions(s) / UC Diagnoses   Final diagnoses:  Viral URI with cough  Encounter for screening laboratory testing for COVID-19 virus     Discharge Instructions      Daily loratadine/Claritin for congestion and drainage Mucinex DM twice daily for congestion and cough Use with Tessalon/benzonatate every 8 hours for cough during the day Robitussin with codeine for nighttime cough Drink plenty of fluids Tylenol and ibuprofen for lower rib discomfort with cough Follow-up if not improving or worsening     ED Prescriptions     Medication Sig Dispense Auth. Provider   benzonatate (TESSALON) 200 MG capsule Take 1 capsule (200 mg total) by mouth 3 (three) times daily as needed for up to 7 days for cough. 28 capsule Verneice Caspers C, PA-C   dextromethorphan-guaiFENesin (MUCINEX DM) 30-600 MG 12hr tablet Take 1  tablet by mouth 2 (two) times daily. 15 tablet Lazariah Savard C, PA-C   guaiFENesin-codeine 100-10 MG/5ML syrup Take 5-10 mLs by mouth at bedtime as needed for cough. 120 mL Yasheka Fossett C, PA-C   loratadine (CLARITIN) 10 MG tablet Take 1 tablet (10 mg total) by mouth daily. 15 tablet Aaliayah Miao, [place] C, PA-C      I have reviewed the PDMP during this encounter.   Lew Dawes, [place] 11/24/20 1658

## 2020-11-25 LAB — NOVEL CORONAVIRUS, NAA: SARS-CoV-2, NAA: DETECTED — AB

## 2020-11-25 LAB — SARS-COV-2, NAA 2 DAY TAT

## 2020-12-29 ENCOUNTER — Other Ambulatory Visit (HOSPITAL_BASED_OUTPATIENT_CLINIC_OR_DEPARTMENT_OTHER): Payer: Self-pay

## 2020-12-29 ENCOUNTER — Other Ambulatory Visit: Payer: Self-pay

## 2020-12-29 ENCOUNTER — Ambulatory Visit: Payer: Self-pay | Attending: Internal Medicine

## 2020-12-29 DIAGNOSIS — Z23 Encounter for immunization: Secondary | ICD-10-CM

## 2020-12-29 MED ORDER — PFIZER-BIONT COVID-19 VAC-TRIS 30 MCG/0.3ML IM SUSP
INTRAMUSCULAR | 0 refills | Status: AC
  Filled 2020-12-29: qty 0.3, 1d supply, fill #0

## 2020-12-29 NOTE — Progress Notes (Signed)
   Covid-19 Vaccination Clinic  Name:  Mattheus Rauls    MRN: [id number] DOB: 11-09-1945  12/29/2020  Mr. Devino was observed post Covid-19 immunization for 15 minutes without incident. He was provided with Vaccine Information Sheet and instruction to access the V-Safe system.   Mr. Merrow was instructed to call 911 with any severe reactions post vaccine: Difficulty breathing  Swelling of face and throat  A fast heartbeat  A bad rash all over body  Dizziness and weakness   Immunizations Administered     Name Date Dose VIS Date Route   PFIZER Comrnaty(Gray TOP) Covid-19 Vaccine 12/29/2020 12:58 PM 0.3 mL 05/14/2020 Intramuscular   Manufacturer: [employer], [employer]   Lot: [id number]   NDC: [phone]

## 2021-07-13 DIAGNOSIS — J309 Allergic rhinitis, unspecified: Secondary | ICD-10-CM | POA: Diagnosis not present

## 2021-07-13 DIAGNOSIS — J301 Allergic rhinitis due to pollen: Secondary | ICD-10-CM | POA: Diagnosis not present

## 2021-07-13 DIAGNOSIS — J339 Nasal polyp, unspecified: Secondary | ICD-10-CM | POA: Diagnosis not present

## 2021-07-13 DIAGNOSIS — J3089 Other allergic rhinitis: Secondary | ICD-10-CM | POA: Diagnosis not present

## 2021-07-13 DIAGNOSIS — J324 Chronic pansinusitis: Secondary | ICD-10-CM | POA: Diagnosis not present

## 2021-07-13 DIAGNOSIS — J3489 Other specified disorders of nose and nasal sinuses: Secondary | ICD-10-CM | POA: Diagnosis not present

## 2021-07-13 DIAGNOSIS — J341 Cyst and mucocele of nose and nasal sinus: Secondary | ICD-10-CM | POA: Diagnosis not present

## 2021-07-13 DIAGNOSIS — Z79899 Other long term (current) drug therapy: Secondary | ICD-10-CM | POA: Diagnosis not present

## 2021-07-13 DIAGNOSIS — H21509 Unspecified adhesions of iris and ciliary body, unspecified eye: Secondary | ICD-10-CM | POA: Diagnosis not present

## 2021-10-07 ENCOUNTER — Emergency Department (HOSPITAL_COMMUNITY): Payer: Medicare Other

## 2021-10-07 ENCOUNTER — Other Ambulatory Visit: Payer: Self-pay

## 2021-10-07 ENCOUNTER — Emergency Department (HOSPITAL_COMMUNITY)
Admission: EM | Admit: 2021-10-07 | Discharge: 2021-10-07 | Disposition: A | Discharge status: Home / Self Care | Payer: Medicare Other | Attending: Emergency Medicine | Admitting: Emergency Medicine

## 2021-10-07 ENCOUNTER — Encounter (HOSPITAL_COMMUNITY): Payer: Self-pay

## 2021-10-07 DIAGNOSIS — E1165 Type 2 diabetes mellitus with hyperglycemia: Secondary | ICD-10-CM | POA: Insufficient documentation

## 2021-10-07 DIAGNOSIS — J45909 Unspecified asthma, uncomplicated: Secondary | ICD-10-CM | POA: Insufficient documentation

## 2021-10-07 DIAGNOSIS — I10 Essential (primary) hypertension: Secondary | ICD-10-CM | POA: Diagnosis not present

## 2021-10-07 DIAGNOSIS — R7309 Other abnormal glucose: Secondary | ICD-10-CM

## 2021-10-07 DIAGNOSIS — R61 Generalized hyperhidrosis: Secondary | ICD-10-CM | POA: Insufficient documentation

## 2021-10-07 DIAGNOSIS — R55 Syncope and collapse: Secondary | ICD-10-CM | POA: Insufficient documentation

## 2021-10-07 DIAGNOSIS — Z79899 Other long term (current) drug therapy: Secondary | ICD-10-CM | POA: Insufficient documentation

## 2021-10-07 DIAGNOSIS — Z7984 Long term (current) use of oral hypoglycemic drugs: Secondary | ICD-10-CM | POA: Insufficient documentation

## 2021-10-07 LAB — CBC WITH DIFFERENTIAL/PLATELET
Abs Immature Granulocytes: 0.01 10*3/uL (ref 0.00–0.07)
Basophils Absolute: 0 10*3/uL (ref 0.0–0.1)
Basophils Relative: 1 %
Eosinophils Absolute: 0.1 10*3/uL (ref 0.0–0.5)
Eosinophils Relative: 3 %
HCT: 41.1 % (ref 39.0–52.0)
Hemoglobin: 13.2 g/dL (ref 13.0–17.0)
Immature Granulocytes: 0 %
Lymphocytes Relative: 38 %
Lymphs Abs: 2 10*3/uL (ref 0.7–4.0)
MCH: 26.5 pg (ref 26.0–34.0)
MCHC: 32.1 g/dL (ref 30.0–36.0)
MCV: 82.4 fL (ref 80.0–100.0)
Monocytes Absolute: 0.4 10*3/uL (ref 0.1–1.0)
Monocytes Relative: 8 %
Neutro Abs: 2.6 10*3/uL (ref 1.7–7.7)
Neutrophils Relative %: 50 %
Platelets: 191 10*3/uL (ref 150–400)
RBC: 4.99 MIL/uL (ref 4.22–5.81)
RDW: 13.2 % (ref 11.5–15.5)
WBC: 5.2 10*3/uL (ref 4.0–10.5)
nRBC: 0 % (ref 0.0–0.2)

## 2021-10-07 LAB — COMPREHENSIVE METABOLIC PANEL
ALT: 20 U/L (ref 0–44)
AST: 19 U/L (ref 15–41)
Albumin: 3.8 g/dL (ref 3.5–5.0)
Alkaline Phosphatase: 52 U/L (ref 38–126)
Anion gap: 7 (ref 5–15)
BUN: 18 mg/dL (ref 8–23)
CO2: 26 mmol/L (ref 22–32)
Calcium: 9.2 mg/dL (ref 8.9–10.3)
Chloride: 105 mmol/L (ref 98–111)
Creatinine, Ser: 1.38 mg/dL — ABNORMAL HIGH (ref 0.61–1.24)
GFR, Estimated: 52 mL/min — ABNORMAL LOW (ref >60)
Glucose, Bld: 253 mg/dL — ABNORMAL HIGH (ref 70–99)
Potassium: 4 mmol/L (ref 3.5–5.1)
Sodium: 138 mmol/L (ref 135–145)
Total Bilirubin: 1.6 mg/dL — ABNORMAL HIGH (ref 0.3–1.2)
Total Protein: 7.4 g/dL (ref 6.5–8.1)

## 2021-10-07 LAB — URINALYSIS, ROUTINE W REFLEX MICROSCOPIC
Bilirubin Urine: NEGATIVE
Glucose, UA: >=500 mg/dL — AB
Hgb urine dipstick: NEGATIVE
Ketones, ur: NEGATIVE mg/dL
Leukocytes,Ua: NEGATIVE
Nitrite: NEGATIVE
Protein, ur: NEGATIVE mg/dL
Specific Gravity, Urine: 1.015 (ref 1.005–1.030)
pH: 6 (ref 5.0–8.0)

## 2021-10-07 LAB — TROPONIN I (HIGH SENSITIVITY)
Troponin I (High Sensitivity): 4 ng/L (ref <18)
Troponin I (High Sensitivity): 3 ng/L (ref <18)

## 2021-10-07 LAB — CBG MONITORING, ED: Glucose-Capillary: 248 mg/dL — ABNORMAL HIGH (ref 70–99)

## 2021-10-07 NOTE — Discharge Instructions (Addendum)
Please follow up with PCP regarding elevated glucose  ? ? ?Have someone stay with you until you feel stable. Do not drive, operate machinery, or play sports until your caregiver says it is okay. Keep all follow-up appointments as directed by your caregiver. Lie down right away if you start feeling like you might faint. Breathe deeply and steadily. Wait until all the symptoms have passed.Drink enough fluids to keep your urine clear or pale yellow. If you are taking blood pressure or heart medicine, get up slowly, taking several minutes to sit and then stand. This can reduce dizziness. SEEK IMMEDIATE MEDICAL CARE IF: You have a severe headache. You have unusual pain in the chest, abdomen, or back. You are bleeding from the mouth or rectum, or you have a black or tarry stool. You have an irregular or very fast heartbeat. You have pain with breathing. You have repeated fainting or seizure-like jerking during an episode. You faint when sitting or lying down. You have confusion. You have difficulty walking. You have severe weakness. You have vision problems. If you fainted, call your local emergency services - do not drive yourself to the hospital. ?  ?Please return to the emergency department immediately for any new or concerning symptoms, or if you get worse. ? ?

## 2021-10-07 NOTE — ED Notes (Addendum)
Patient Alert and oriented to baseline. Stable and ambulatory to baseline. Patient verbalized understanding of the discharge instructions.  Patient belongings were taken by the patient.   

## 2021-10-07 NOTE — ED Triage Notes (Signed)
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 Pt never passed out but did drop to the floor. Denies any chest pain or sob. ?

## 2021-10-07 NOTE — ED Provider Notes (Signed)
?[facility]-EMERGENCY DEPT ?Provider Note ? ? ?CSN: [id number] ?Arrival date & time: 10/07/21  0750 ? ?  ? ?History ? ?Chief Complaint  ?Patient presents with  ? Near Syncope  ? ? ?Travis Dalton is a 76 y.o. male. ? ? Patient as above with significant medical history as below, including asthma, COPD, hypertension, hyperlipidemia, PTSD who presents to the ED with complaint of syncope. ? ?Patient reports just prior to arrival he was upstairs visiting his spouse regarding a diagnosis concerning possible cancer.  He felt very hot, diaphoretic, he took his hat off and then woke up on the floor.  Family says he was lowered to the floor and did not fall to the floor.  No chest pain, nausea or vomiting, numbness or tingling or headache reported prior to or following the episode.  No incontinence.  No seizure-like activity was reported.  He feels currently back to his baseline at this time.  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  He currently is no complaints.  No recent medication or diet changes.  No chest pain abdominal pain, nausea or vomiting, change in bowel or bladder function.  No dyspnea. ? ? ?Past Medical History: ?No date: Asthma ?No date: Chronic bronchitis (HCC) ?No date: COPD (chronic obstructive pulmonary disease) (HCC) ?No date: High cholesterol ?No date: Hypertension ?No date: Inguinal hernia ?    Comment:  unrepaired; left groin (03/15/2012) ?No date: Multiple allergies ?03/15/2012: Periorbital cellulitis ?No date: PTSD (post-traumatic stress disorder) ?    Comment:  "40%" (03/15/2012) ?No date: Shortness of breath ?    Comment:  "occasionally; at any time; related to my asthma"  ?             (03/15/2012) ?No date: Sinusitis ?No date: Type II diabetes mellitus (HCC) ? ?Past Surgical History: ?~ 2000: SHOULDER ARTHROSCOPY W/ ROTATOR CUFF REPAIR ?    Comment:  right  ? ? ?The history is provided by the patient. No language interpreter was used.  ?Near  Syncope ?Pertinent negatives include no chest pain, no abdominal pain, no headaches and no shortness of breath.  ? ?  ? ?Home Medications ?Prior to Admission medications   ?Medication Sig Start Date End Date Taking? Authorizing Provider  ?amLODipine (NORVASC) 5 MG tablet Take 5 mg by mouth daily.    [provider]  ?cetirizine (ZYRTEC) 10 MG tablet Take 10 mg by mouth at bedtime as needed for allergies.     [provider]  ?COVID-19 mRNA Vac-TriS, Pfizer, (PFIZER-BIONT COVID-19 VAC-TRIS) SUSP injection Inject into the muscle. 12/29/20   Judyann MunsonSnider, Cynthia, MD  ?dextromethorphan-guaiFENesin [facility](MUCINEX DM) 30-600 MG 12hr tablet Take 1 tablet by mouth 2 (two) times daily. 11/24/20   Wieters, Hallie C, PA-C  ?guaiFENesin-codeine 100-10 MG/5ML syrup Take 5-10 mLs by mouth at bedtime as needed for cough. 11/24/20   Wieters, Hallie C, PA-C  ?hydrochlorothiazide (HYDRODIURIL) 25 MG tablet Take 12.5 mg by mouth daily.    [provider]  ?loratadine (CLARITIN) 10 MG tablet Take 1 tablet (10 mg total) by mouth daily. 11/24/20   Wieters, Hallie C, PA-C  ?metFORMIN (GLUCOPHAGE) 500 MG tablet Take 500 mg by mouth 2 (two) times daily with a meal.     [provider]  ?montelukast (SINGULAIR) 10 MG tablet Take 10 mg by mouth at bedtime as needed (for allergies).     [provider]  ?Sodium Chloride-Sodium Bicarb (NETI POT SINUS WASH NA) Place  1 application into the nose 2 (two) times daily.    [provider]  ?   ? ?Allergies    ?Patient has no known allergies.   ? ?Review of Systems   ?Review of Systems  ?Constitutional:  Positive for diaphoresis. Negative for chills and fever.  ?HENT:  Negative for facial swelling and trouble swallowing.   ?Eyes:  Negative for photophobia and visual disturbance.  ?Respiratory:  Negative for cough and shortness of breath.   ?Cardiovascular:  Positive for near-syncope. Negative for chest pain and palpitations.  ?Gastrointestinal:  Negative for  abdominal pain, nausea and vomiting.  ?Endocrine: Negative for polydipsia and polyuria.  ?Genitourinary:  Negative for difficulty urinating and hematuria.  ?Musculoskeletal:  Negative for gait problem and joint swelling.  ?Skin:  Negative for pallor and rash.  ?Neurological:  Positive for syncope. Negative for headaches.  ?Psychiatric/Behavioral:  Negative for agitation and confusion.   ? ?Physical Exam ?Updated Vital Signs ?BP (!) 174/92   Pulse 77   Temp 98.3 ?F (36.8 ?C) (Oral)   Resp 17   Ht 6' (1.829 m)   Wt 85.3 kg   SpO2 100%   BMI 25.50 kg/m?  ?Physical Exam ?Vitals and nursing note reviewed.  ?Constitutional:   ?   General: He is not in acute distress. ?   Appearance: Normal appearance. He is well-developed.  ?HENT:  ?   Head: Normocephalic and atraumatic.  ?   Right Ear: External ear normal.  ?   Left Ear: External ear normal.  ?   Mouth/Throat:  ?   Mouth: Mucous membranes are moist.  ?Eyes:  ?   General: No scleral icterus. ?   Extraocular Movements: Extraocular movements intact.  ?   Pupils: Pupils are equal, round, and reactive to light.  ?Cardiovascular:  ?   Rate and Rhythm: Normal rate and regular rhythm.  ?   Pulses: Normal pulses.  ?   Heart sounds: Normal heart sounds.  ?Pulmonary:  ?   Effort: Pulmonary effort is normal. No respiratory distress.  ?   Breath sounds: Normal breath sounds.  ?Abdominal:  ?   General: Abdomen is flat.  ?   Palpations: Abdomen is soft.  ?   Tenderness: There is no abdominal tenderness.  ?Musculoskeletal:     ?   General: Normal range of motion.  ?   Cervical back: Full passive range of motion without pain and normal range of motion.  ?   Right lower leg: No edema.  ?   Left lower leg: No edema.  ?Skin: ?   General: Skin is warm and dry.  ?   Capillary Refill: Capillary refill takes less than 2 seconds.  ?Neurological:  ?   Mental Status: He is alert and oriented to person, place, and time.  ?   GCS: GCS eye subscore is 4. GCS verbal subscore is 5. GCS motor  subscore is 6.  ?   Cranial Nerves: Cranial nerves 2-12 are intact. No dysarthria or facial asymmetry.  ?   Sensory: Sensation is intact.  ?   Motor: Motor function is intact. No tremor.  ?   Coordination: Coordination is intact.  ?   Gait: Gait is intact.  ?Psychiatric:     ?   Mood and Affect: Mood normal.     ?   Behavior: Behavior normal.  ? ? ?ED Results / Procedures / Treatments   ?Labs ?(all labs ordered are listed, but only abnormal results are displayed) ?Labs  Reviewed  ?COMPREHENSIVE METABOLIC PANEL - Abnormal; Notable for the following components:  ?    Result Value  ? Glucose, Bld 253 (*)   ? Creatinine, Ser 1.38 (*)   ? Total Bilirubin 1.6 (*)   ? GFR, Estimated 52 (*)   ? All other components within normal limits  ?URINALYSIS, ROUTINE W REFLEX MICROSCOPIC - Abnormal; Notable for the following components:  ? Glucose, UA >=500 (*)   ? Bacteria, UA RARE (*)   ? All other components within normal limits  ?CBG MONITORING, ED - Abnormal; Notable for the following components:  ? Glucose-Capillary 248 (*)   ? All other components within normal limits  ?CBC WITH DIFFERENTIAL/PLATELET  ?TROPONIN I (HIGH SENSITIVITY)  ?TROPONIN I (HIGH SENSITIVITY)  ? ? ?EKG ?EKG Interpretation ? ?Date/Time:  Thursday Oct 07 2021 08:00:50 EDT ?Ventricular Rate:  83 ?PR Interval:  172 ?QRS Duration: 87 ?QT Interval:  364 ?QTC Calculation: 428 ?R Axis:   78 ?Text Interpretation: Sinus rhythm Confirmed by Tanda Rockers ([id number]) on 10/07/2021 9:30:07 AM ? ?Radiology ?DG Chest 1 View ? ?Result Date: 10/07/2021 ?CLINICAL DATA:  Syncope EXAM: CHEST  1 VIEW COMPARISON:  10/28/2013 FINDINGS: The heart size and mediastinal contours are within normal limits. Aortic atherosclerosis. Both lungs are clear. The visualized skeletal structures are unremarkable. IMPRESSION: No active disease. Electronically Signed   By: Duanne Guess D.O.   On: 10/07/2021 09:33   ? ?Procedures ?Procedures  ? ? ?Medications Ordered in ED ?Medications - No data to  display ? ?ED Course/ Medical Decision Making/ A&P ?Clinical Course as of 10/07/21 1321  ?Thu Oct 07, 2021  ?1258 [facility] syncope score is low. [SG]  ?1258 Glucose, UA(!): >=500 [SG]  ?1259 Glucose-Capillary(!): 248
